# Patient Record
Sex: Female | Born: 1995 | Race: White | Hispanic: No | Marital: Married | Smoking: Former smoker
Health system: Southern US, Community
[De-identification: ages and names within clinical notes are randomized; demographics above are authoritative.]

## PROBLEM LIST (undated history)

## (undated) ENCOUNTER — Ambulatory Visit: Admission: EM | Attending: Family Medicine | Admitting: Family Medicine

## (undated) DIAGNOSIS — B20 Human immunodeficiency virus [HIV] disease: Secondary | ICD-10-CM

## (undated) DIAGNOSIS — F419 Anxiety disorder, unspecified: Secondary | ICD-10-CM

## (undated) DIAGNOSIS — F32A Depression, unspecified: Secondary | ICD-10-CM

## (undated) HISTORY — DX: Human immunodeficiency virus (HIV) disease: B20

## (undated) HISTORY — DX: Depression, unspecified: F32.A

## (undated) HISTORY — PX: TYMPANOSTOMY TUBE PLACEMENT: SHX32

## (undated) HISTORY — DX: Anxiety disorder, unspecified: F41.9

---

## 2013-02-21 ENCOUNTER — Emergency Department (HOSPITAL_BASED_OUTPATIENT_CLINIC_OR_DEPARTMENT_OTHER)
Admission: EM | Admit: 2013-02-21 | Discharge: 2013-02-21 | Disposition: A | Discharge status: Home / Self Care | Payer: Medicaid Other | Attending: Emergency Medicine | Admitting: Emergency Medicine

## 2013-02-21 ENCOUNTER — Encounter (HOSPITAL_BASED_OUTPATIENT_CLINIC_OR_DEPARTMENT_OTHER): Payer: Self-pay | Admitting: Emergency Medicine

## 2013-02-21 ENCOUNTER — Emergency Department (HOSPITAL_BASED_OUTPATIENT_CLINIC_OR_DEPARTMENT_OTHER): Payer: Medicaid Other

## 2013-02-21 DIAGNOSIS — J029 Acute pharyngitis, unspecified: Secondary | ICD-10-CM | POA: Insufficient documentation

## 2013-02-21 DIAGNOSIS — S60222A Contusion of left hand, initial encounter: Secondary | ICD-10-CM

## 2013-02-21 DIAGNOSIS — F17200 Nicotine dependence, unspecified, uncomplicated: Secondary | ICD-10-CM | POA: Insufficient documentation

## 2013-02-21 DIAGNOSIS — S60229A Contusion of unspecified hand, initial encounter: Secondary | ICD-10-CM | POA: Insufficient documentation

## 2013-02-21 DIAGNOSIS — S0993XA Unspecified injury of face, initial encounter: Secondary | ICD-10-CM | POA: Insufficient documentation

## 2013-02-21 DIAGNOSIS — R070 Pain in throat: Secondary | ICD-10-CM

## 2013-02-21 MED ORDER — IBUPROFEN 800 MG PO TABS
800.0000 mg | ORAL_TABLET | Freq: Once | ORAL | Status: AC
  Administered 2013-02-21: 800 mg via ORAL
  Filled 2013-02-21: qty 1

## 2013-02-21 NOTE — Discharge Instructions (Signed)
Contusion A contusion is a deep bruise. Contusions are the result of an injury that caused bleeding under the skin. The contusion may turn blue, purple, or yellow. Minor injuries will give you a painless contusion, but more severe contusions may stay painful and swollen for a few weeks.  CAUSES  A contusion is usually caused by a blow, trauma, or direct force to an area of the body. SYMPTOMS   Swelling and redness of the injured area.  Bruising of the injured area.  Tenderness and soreness of the injured area.  Pain. DIAGNOSIS  The diagnosis can be made by taking a history and physical exam. An X-ray, CT scan, or MRI may be needed to determine if there were any associated injuries, such as fractures. TREATMENT  Specific treatment will depend on what area of the body was injured. In general, the best treatment for a contusion is resting, icing, elevating, and applying cold compresses to the injured area. Over-the-counter medicines may also be recommended for pain control. Ask your caregiver what the best treatment is for your contusion. HOME CARE INSTRUCTIONS   Put ice on the injured area.  Put ice in a plastic bag.  Place a towel between your skin and the bag.  Leave the ice on for 15-20 minutes, 3-4 times a day.  Only take over-the-counter or prescription medicines for pain, discomfort, or fever as directed by your caregiver. Your caregiver may recommend avoiding anti-inflammatory medicines (aspirin, ibuprofen, and naproxen) for 48 hours because these medicines may increase bruising.  Rest the injured area.  If possible, elevate the injured area to reduce swelling. SEEK IMMEDIATE MEDICAL CARE IF:   You have increased bruising or swelling.  You have pain that is getting worse.  Your swelling or pain is not relieved with medicines. MAKE SURE YOU:   Understand these instructions.  Will watch your condition.  Will get help right away if you are not doing well or get  worse. Document Released: 01/24/2005 Document Revised: 07/09/2011 Document Reviewed: 02/19/2011 Exit[employer] Patient Information 2014 [employer].  
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
## 2015-01-07 ENCOUNTER — Ambulatory Visit (HOSPITAL_COMMUNITY): Payer: Medicaid Other

## 2019-05-01 HISTORY — PX: WISDOM TOOTH EXTRACTION: SHX21

## 2021-07-18 ENCOUNTER — Encounter: Payer: Self-pay | Admitting: *Deleted

## 2021-07-18 ENCOUNTER — Telehealth: Payer: Self-pay | Admitting: Family Medicine

## 2021-07-18 NOTE — Telephone Encounter (Signed)
Patient was referred, I called to setup intake and new OB but was not able to reach patient.Marland KitchenMarland KitchenI left patient a voicemail to call back and schedule. ?

## 2021-07-18 NOTE — Telephone Encounter (Signed)
Patient called in regards to setting up prenatal care, after consulting with the patient she is not 100% sure of her GA due to having irregular periods and having conflicting dates to go off of. I told the patient I would send a message to the nurses and see if a dating ultrasound could be scheduled.  ?

## 2021-07-19 ENCOUNTER — Encounter: Payer: Self-pay | Admitting: Certified Nurse Midwife

## 2021-07-21 NOTE — Telephone Encounter (Signed)
Returned call to patient; VM left stating I am calling to follow up on her phone call to schedule appt. ? ?Per chart review, pt had [place] at [facility] on 07/06/21 that could not confirm IUP. Serial beta HCG completed 07/06/21 62.5 and 07/09/21 113.9. ? ?If patient calls back, will need provider review and recommendation for [place] vs serial beta HCG prior to scheduling new OB. ?

## 2021-08-06 ENCOUNTER — Emergency Department: Payer: Medicaid Other | Admitting: Anesthesiology

## 2021-08-06 ENCOUNTER — Emergency Department: Payer: Medicaid Other

## 2021-08-06 ENCOUNTER — Encounter
Admission: EM | Disposition: A | Discharge status: Home / Self Care | Payer: Self-pay | Source: Home / Self Care | Attending: Emergency Medicine

## 2021-08-06 ENCOUNTER — Ambulatory Visit
Admission: EM | Admit: 2021-08-06 | Discharge: 2021-08-06 | Disposition: A | Discharge status: Home / Self Care | Payer: Medicaid Other | Attending: Emergency Medicine | Admitting: Emergency Medicine

## 2021-08-06 ENCOUNTER — Other Ambulatory Visit: Payer: Self-pay

## 2021-08-06 DIAGNOSIS — O99331 Smoking (tobacco) complicating pregnancy, first trimester: Secondary | ICD-10-CM | POA: Diagnosis not present

## 2021-08-06 DIAGNOSIS — O99330 Smoking (tobacco) complicating pregnancy, unspecified trimester: Secondary | ICD-10-CM | POA: Diagnosis not present

## 2021-08-06 DIAGNOSIS — O008 Other ectopic pregnancy without intrauterine pregnancy: Secondary | ICD-10-CM

## 2021-08-06 DIAGNOSIS — O009 Unspecified ectopic pregnancy without intrauterine pregnancy: Secondary | ICD-10-CM

## 2021-08-06 DIAGNOSIS — Z3A01 Less than 8 weeks gestation of pregnancy: Secondary | ICD-10-CM | POA: Insufficient documentation

## 2021-08-06 DIAGNOSIS — F17210 Nicotine dependence, cigarettes, uncomplicated: Secondary | ICD-10-CM | POA: Diagnosis not present

## 2021-08-06 DIAGNOSIS — O209 Hemorrhage in early pregnancy, unspecified: Secondary | ICD-10-CM | POA: Diagnosis present

## 2021-08-06 HISTORY — DX: Other ectopic pregnancy without intrauterine pregnancy: O00.80

## 2021-08-06 HISTORY — PX: XI ROBOTIC ASSISTED SALPINGECTOMY: SHX6824

## 2021-08-06 LAB — CBC WITH DIFFERENTIAL/PLATELET
Abs Immature Granulocytes: 0.03 10*3/uL (ref 0.00–0.07)
Basophils Absolute: 0 10*3/uL (ref 0.0–0.1)
Basophils Relative: 0 %
Eosinophils Absolute: 0.1 10*3/uL (ref 0.0–0.5)
Eosinophils Relative: 1 %
HCT: 44 % (ref 36.0–46.0)
Hemoglobin: 14.8 g/dL (ref 12.0–15.0)
Immature Granulocytes: 0 %
Lymphocytes Relative: 23 %
Lymphs Abs: 1.9 10*3/uL (ref 0.7–4.0)
MCH: 29 pg (ref 26.0–34.0)
MCHC: 33.6 g/dL (ref 30.0–36.0)
MCV: 86.1 fL (ref 80.0–100.0)
Monocytes Absolute: 0.5 10*3/uL (ref 0.1–1.0)
Monocytes Relative: 6 %
Neutro Abs: 5.9 10*3/uL (ref 1.7–7.7)
Neutrophils Relative %: 70 %
Platelets: 331 10*3/uL (ref 150–400)
RBC: 5.11 MIL/uL (ref 3.87–5.11)
RDW: 12.6 % (ref 11.5–15.5)
WBC: 8.5 10*3/uL (ref 4.0–10.5)
nRBC: 0 % (ref 0.0–0.2)

## 2021-08-06 LAB — URINALYSIS, ROUTINE W REFLEX MICROSCOPIC
Bacteria, UA: NONE SEEN
Bilirubin Urine: NEGATIVE
Glucose, UA: NEGATIVE mg/dL
Ketones, ur: NEGATIVE mg/dL
Nitrite: NEGATIVE
Protein, ur: NEGATIVE mg/dL
Specific Gravity, Urine: 1.019 (ref 1.005–1.030)
pH: 9 — ABNORMAL HIGH (ref 5.0–8.0)

## 2021-08-06 LAB — POC URINE PREG, ED: Preg Test, Ur: POSITIVE — AB

## 2021-08-06 LAB — TYPE AND SCREEN
ABO/RH(D): A POS
Antibody Screen: NEGATIVE

## 2021-08-06 LAB — HCG, QUANTITATIVE, PREGNANCY: hCG, Beta Chain, Quant, S: 6924 m[IU]/mL — ABNORMAL HIGH (ref <5)

## 2021-08-06 SURGERY — SALPINGECTOMY, ROBOT-ASSISTED
Anesthesia: General | Site: Abdomen | Laterality: Left

## 2021-08-06 MED ORDER — PROPOFOL 10 MG/ML IV BOLUS
INTRAVENOUS | Status: DC | PRN
  Administered 2021-08-06: 50 mg via INTRAVENOUS

## 2021-08-06 MED ORDER — FENTANYL CITRATE (PF) 100 MCG/2ML IJ SOLN
INTRAMUSCULAR | Status: AC
  Administered 2021-08-06: 50 ug via INTRAVENOUS
  Filled 2021-08-06: qty 2

## 2021-08-06 MED ORDER — OXYCODONE HCL 5 MG PO TABS: 5.0000 mg | ORAL_TABLET | Freq: Three times a day (TID) | ORAL | 0 refills | Status: DC | PRN

## 2021-08-06 MED ORDER — SODIUM CHLORIDE (PF) 0.9 % IJ SOLN
INTRAMUSCULAR | Status: AC
  Filled 2021-08-06: qty 50

## 2021-08-06 MED ORDER — PROPOFOL 10 MG/ML IV BOLUS
INTRAVENOUS | Status: AC
  Filled 2021-08-06: qty 20

## 2021-08-06 MED ORDER — CEFAZOLIN SODIUM-DEXTROSE 2-3 GM-%(50ML) IV SOLR
INTRAVENOUS | Status: DC | PRN
  Administered 2021-08-06: 2 g via INTRAVENOUS

## 2021-08-06 MED ORDER — KETAMINE HCL 50 MG/5ML IJ SOSY
PREFILLED_SYRINGE | INTRAMUSCULAR | Status: AC
  Filled 2021-08-06: qty 5

## 2021-08-06 MED ORDER — DEXAMETHASONE SODIUM PHOSPHATE 10 MG/ML IJ SOLN
INTRAMUSCULAR | Status: DC | PRN
  Administered 2021-08-06: 10 mg via INTRAVENOUS

## 2021-08-06 MED ORDER — LACTATED RINGERS IV SOLN: INTRAVENOUS | Status: DC

## 2021-08-06 MED ORDER — LIDOCAINE HCL (CARDIAC) PF 100 MG/5ML IV SOSY
PREFILLED_SYRINGE | INTRAVENOUS | Status: DC | PRN
  Administered 2021-08-06: 60 mg via INTRAVENOUS

## 2021-08-06 MED ORDER — SODIUM CHLORIDE 0.9 % IV BOLUS
1000.0000 mL | Freq: Once | INTRAVENOUS | Status: AC
  Administered 2021-08-06: 1000 mL via INTRAVENOUS

## 2021-08-06 MED ORDER — ACETAMINOPHEN 500 MG PO TABS: 1000.0000 mg | ORAL_TABLET | Freq: Once | ORAL | Status: AC

## 2021-08-06 MED ORDER — SODIUM CHLORIDE (PF) 0.9 % IJ SOLN
INTRAMUSCULAR | Status: AC
  Filled 2021-08-06: qty 20

## 2021-08-06 MED ORDER — OXYCODONE HCL 5 MG/5ML PO SOLN: 5.0000 mg | Freq: Once | ORAL | Status: AC | PRN

## 2021-08-06 MED ORDER — FENTANYL CITRATE (PF) 100 MCG/2ML IJ SOLN
INTRAMUSCULAR | Status: DC | PRN
  Administered 2021-08-06 (×3): 50 ug via INTRAVENOUS

## 2021-08-06 MED ORDER — ACETAMINOPHEN 500 MG PO TABS
1000.0000 mg | ORAL_TABLET | Freq: Four times a day (QID) | ORAL | 0 refills | Status: DC | PRN
Start: 2021-08-06 — End: 2021-08-29

## 2021-08-06 MED ORDER — SUCCINYLCHOLINE CHLORIDE 200 MG/10ML IV SOSY
PREFILLED_SYRINGE | INTRAVENOUS | Status: DC | PRN
  Administered 2021-08-06: 100 mg via INTRAVENOUS

## 2021-08-06 MED ORDER — ONDANSETRON HCL 4 MG/2ML IJ SOLN: 4.0000 mg | Freq: Once | INTRAMUSCULAR | Status: DC | PRN

## 2021-08-06 MED ORDER — KETOROLAC TROMETHAMINE 30 MG/ML IJ SOLN
INTRAMUSCULAR | Status: DC | PRN
Start: 2021-08-06 — End: 2021-08-06
  Administered 2021-08-06: 30 mg via INTRAVENOUS

## 2021-08-06 MED ORDER — IBUPROFEN 600 MG PO TABS
ORAL_TABLET | ORAL | Status: AC
  Administered 2021-08-06: 600 mg via ORAL
  Filled 2021-08-06: qty 1

## 2021-08-06 MED ORDER — CEFAZOLIN SODIUM 1 G IJ SOLR
INTRAMUSCULAR | Status: AC
  Filled 2021-08-06: qty 20

## 2021-08-06 MED ORDER — MORPHINE SULFATE (PF) 4 MG/ML IV SOLN: 1.0000 mg | INTRAVENOUS | Status: DC | PRN

## 2021-08-06 MED ORDER — PHENYLEPHRINE HCL (PRESSORS) 10 MG/ML IV SOLN
INTRAVENOUS | Status: DC | PRN
  Administered 2021-08-06: 160 ug via INTRAVENOUS
  Administered 2021-08-06 (×2): 80 ug via INTRAVENOUS

## 2021-08-06 MED ORDER — IBUPROFEN 600 MG PO TABS
600.0000 mg | ORAL_TABLET | Freq: Four times a day (QID) | ORAL | 0 refills | Status: DC | PRN
Start: 2021-08-06 — End: 2021-08-29

## 2021-08-06 MED ORDER — PHENYLEPHRINE HCL (PRESSORS) 10 MG/ML IV SOLN
INTRAVENOUS | Status: AC
  Filled 2021-08-06: qty 1

## 2021-08-06 MED ORDER — MIDAZOLAM HCL 2 MG/2ML IJ SOLN
INTRAMUSCULAR | Status: DC | PRN
Start: 2021-08-06 — End: 2021-08-06
  Administered 2021-08-06: 2 mg via INTRAVENOUS

## 2021-08-06 MED ORDER — KETAMINE HCL 10 MG/ML IJ SOLN
INTRAMUSCULAR | Status: DC | PRN
  Administered 2021-08-06: 25 mg via INTRAVENOUS
  Administered 2021-08-06: 5 mg via INTRAVENOUS

## 2021-08-06 MED ORDER — VASOPRESSIN 20 UNIT/ML IV SOLN
INTRAVENOUS | Status: AC
  Filled 2021-08-06: qty 1

## 2021-08-06 MED ORDER — ACETAMINOPHEN 500 MG PO TABS
ORAL_TABLET | ORAL | Status: AC
  Administered 2021-08-06: 1000 mg via ORAL
  Filled 2021-08-06: qty 2

## 2021-08-06 MED ORDER — OXYCODONE HCL 5 MG PO TABS
ORAL_TABLET | ORAL | Status: AC
  Administered 2021-08-06: 5 mg via ORAL
  Filled 2021-08-06: qty 1

## 2021-08-06 MED ORDER — FENTANYL CITRATE (PF) 100 MCG/2ML IJ SOLN
25.0000 ug | INTRAMUSCULAR | Status: DC | PRN
  Administered 2021-08-06 (×2): 25 ug via INTRAVENOUS

## 2021-08-06 MED ORDER — 0.9 % SODIUM CHLORIDE (POUR BTL) OPTIME
TOPICAL | Status: DC | PRN
Start: 2021-08-06 — End: 2021-08-06
  Administered 2021-08-06: 10 mL

## 2021-08-06 MED ORDER — SODIUM CHLORIDE 0.9 % IR SOLN
Status: DC | PRN
  Administered 2021-08-06: 300 mL

## 2021-08-06 MED ORDER — OXYCODONE HCL 5 MG PO TABS: 5.0000 mg | ORAL_TABLET | Freq: Once | ORAL | Status: AC | PRN

## 2021-08-06 MED ORDER — ONDANSETRON HCL 4 MG/2ML IJ SOLN
INTRAMUSCULAR | Status: DC | PRN
  Administered 2021-08-06: 4 mg via INTRAVENOUS

## 2021-08-06 MED ORDER — VASOPRESSIN 20 UNIT/ML IV SOLN
INTRAVENOUS | Status: DC | PRN
  Administered 2021-08-06: 25 mL via INTRAMUSCULAR

## 2021-08-06 MED ORDER — POVIDONE-IODINE 10 % EX SWAB
2.0000 "application " | Freq: Once | CUTANEOUS | Status: DC
  Filled 2021-08-06: qty 2

## 2021-08-06 MED ORDER — BUPIVACAINE LIPOSOME 1.3 % IJ SUSP
INTRAMUSCULAR | Status: DC | PRN
  Administered 2021-08-06: 20 mL

## 2021-08-06 MED ORDER — FENTANYL CITRATE (PF) 100 MCG/2ML IJ SOLN
INTRAMUSCULAR | Status: AC
Start: 2021-08-06 — End: ?
  Filled 2021-08-06: qty 2

## 2021-08-06 MED ORDER — HEMOSTATIC AGENTS (NO CHARGE) OPTIME
TOPICAL | Status: DC | PRN
  Administered 2021-08-06: 1 via TOPICAL

## 2021-08-06 MED ORDER — ROCURONIUM BROMIDE 100 MG/10ML IV SOLN
INTRAVENOUS | Status: DC | PRN
  Administered 2021-08-06: 10 mg via INTRAVENOUS
  Administered 2021-08-06 (×2): 20 mg via INTRAVENOUS
  Administered 2021-08-06: 40 mg via INTRAVENOUS
  Administered 2021-08-06: 10 mg via INTRAVENOUS

## 2021-08-06 MED ORDER — MIDAZOLAM HCL 2 MG/2ML IJ SOLN
INTRAMUSCULAR | Status: AC
  Filled 2021-08-06: qty 2

## 2021-08-06 MED ORDER — KETOROLAC TROMETHAMINE 30 MG/ML IJ SOLN
INTRAMUSCULAR | Status: AC
  Filled 2021-08-06: qty 1

## 2021-08-06 MED ORDER — SUGAMMADEX SODIUM 200 MG/2ML IV SOLN
INTRAVENOUS | Status: DC | PRN
  Administered 2021-08-06: 170 mg via INTRAVENOUS

## 2021-08-06 MED ORDER — IBUPROFEN 600 MG PO TABS
600.0000 mg | ORAL_TABLET | Freq: Once | ORAL | Status: AC
  Filled 2021-08-06: qty 1

## 2021-08-06 MED ORDER — FENTANYL CITRATE (PF) 100 MCG/2ML IJ SOLN
INTRAMUSCULAR | Status: AC
  Filled 2021-08-06: qty 2

## 2021-08-06 SURGICAL SUPPLY — 78 items
ADH SKN CLS APL DERMABOND .7 (GAUZE/BANDAGES/DRESSINGS) ×1
APL PRP STRL LF DISP 70% ISPRP (MISCELLANEOUS)
APL SRG 38 LTWT LNG FL B (MISCELLANEOUS) ×1
APPLICATOR ARISTA FLEXITIP XL (MISCELLANEOUS) ×1 IMPLANT
BACTOSHIELD CHG 4% 4OZ (MISCELLANEOUS) ×1
BAG DRN RND TRDRP ANRFLXCHMBR (UROLOGICAL SUPPLIES) ×1
BAG LAPAROSCOPIC 12 15 PORT 16 (BASKET) IMPLANT
BAG RETRIEVAL 12/15 (BASKET)
BAG URINE DRAIN 2000ML AR STRL (UROLOGICAL SUPPLIES) ×2 IMPLANT
BASIN GRAD PLASTIC 32OZ STRL (MISCELLANEOUS) ×2 IMPLANT
BLADE SURG SZ10 CARB STEEL (BLADE) IMPLANT
BLADE SURG SZ11 CARB STEEL (BLADE) ×2 IMPLANT
CANNULA REDUC XI 12-8 STAPL (CANNULA) ×1
CANNULA REDUCER 12-8 DVNC XI (CANNULA) IMPLANT
CATH FOLEY SIL 2WAY 14FR5CC (CATHETERS) ×2 IMPLANT
CHLORAPREP W/TINT 26 (MISCELLANEOUS) ×1 IMPLANT
COVER MAYO STAND REUSABLE (DRAPES) ×2 IMPLANT
COVER WAND RF STERILE (DRAPES) ×1 IMPLANT
DERMABOND ADVANCED (GAUZE/BANDAGES/DRESSINGS) ×1
DERMABOND ADVANCED .7 DNX12 (GAUZE/BANDAGES/DRESSINGS) ×1 IMPLANT
DRAPE ARM DVNC X/XI (DISPOSABLE) ×3 IMPLANT
DRAPE COLUMN DVNC XI (DISPOSABLE) ×1 IMPLANT
DRAPE DA VINCI XI ARM (DISPOSABLE) ×4
DRAPE DA VINCI XI COLUMN (DISPOSABLE) ×1
DRAPE ROBOT W/ LEGGING 30X125 (DRAPES) ×2 IMPLANT
DRSG TEGADERM 2-3/8X2-3/4 SM (GAUZE/BANDAGES/DRESSINGS) ×3 IMPLANT
ELECT REM PT RETURN 9FT ADLT (ELECTROSURGICAL) ×2
ELECTRODE REM PT RTRN 9FT ADLT (ELECTROSURGICAL) ×1 IMPLANT
GAUZE 4X4 16PLY ~~LOC~~+RFID DBL (SPONGE) ×2 IMPLANT
GLOVE SURG SYN 6.5 ES PF (GLOVE) ×10 IMPLANT
GLOVE SURG SYN 6.5 PF PI (GLOVE) ×3 IMPLANT
GLOVE SURG UNDER POLY LF SZ6.5 (GLOVE) ×8 IMPLANT
GOWN STRL REUS W/ TWL LRG LVL3 (GOWN DISPOSABLE) ×3 IMPLANT
GOWN STRL REUS W/TWL LRG LVL3 (GOWN DISPOSABLE) ×6
GRASPER SUT TROCAR 14GX15 (MISCELLANEOUS) ×2 IMPLANT
HEMOSTAT ARISTA ABSORB 3G PWDR (HEMOSTASIS) ×1 IMPLANT
IRRIGATION STRYKERFLOW (MISCELLANEOUS) IMPLANT
IRRIGATOR STRYKERFLOW (MISCELLANEOUS)
IRRIGATOR SUCT 8 DISP DVNC XI (IRRIGATION / IRRIGATOR) IMPLANT
IRRIGATOR SUCTION 8MM XI DISP (IRRIGATION / IRRIGATOR) ×1
IV NS 1000ML (IV SOLUTION) ×2
IV NS 1000ML BAXH (IV SOLUTION) IMPLANT
KIT PINK PAD W/HEAD ARE REST (MISCELLANEOUS) ×2 IMPLANT
KIT PINK PAD W/HEAD ARM REST (MISCELLANEOUS) ×1 IMPLANT
KONNER MANUPULATOR 6003 ×1 IMPLANT
LABEL OR SOLS (LABEL) ×2 IMPLANT
MANIFOLD NEPTUNE II (INSTRUMENTS) ×1 IMPLANT
MANIPULATOR URINE KRONNER 5 (MISCELLANEOUS) IMPLANT
MANIPULATOR UTERINE 4.5 ZUMI (MISCELLANEOUS) ×2 IMPLANT
NEEDLE HYPO 22GX1.5 SAFETY (NEEDLE) ×2 IMPLANT
NS IRRIG 1000ML POUR BTL (IV SOLUTION) ×4 IMPLANT
OBTURATOR OPTICAL STANDARD 8MM (TROCAR) ×1
OBTURATOR OPTICAL STND 8 DVNC (TROCAR) ×1
OBTURATOR OPTICALSTD 8 DVNC (TROCAR) ×1 IMPLANT
PACK GYN LAPAROSCOPIC (MISCELLANEOUS) ×2 IMPLANT
PAD ARMBOARD 7.5X6 YLW CONV (MISCELLANEOUS) ×1 IMPLANT
PAD OB MATERNITY 4.3X12.25 (PERSONAL CARE ITEMS) ×2 IMPLANT
PAD PREP 24X41 OB/GYN DISP (PERSONAL CARE ITEMS) ×2 IMPLANT
SCRUB CHG 4% DYNA-HEX 4OZ (MISCELLANEOUS) ×1 IMPLANT
SEAL CANN UNIV 5-8 DVNC XI (MISCELLANEOUS) ×3 IMPLANT
SEAL XI 5MM-8MM UNIVERSAL (MISCELLANEOUS) ×3
SEALER VESSEL DA VINCI XI (MISCELLANEOUS) ×1
SEALER VESSEL EXT DVNC XI (MISCELLANEOUS) ×1 IMPLANT
SET TUBE SMOKE EVAC HIGH FLOW (TUBING) ×2 IMPLANT
SPONGE GAUZE 2X2 8PLY STRL LF (GAUZE/BANDAGES/DRESSINGS) ×3 IMPLANT
STAPLER CANNULA SEAL DVNC XI (STAPLE) IMPLANT
STAPLER CANNULA SEAL XI (STAPLE) ×1
SURGILUBE 2OZ TUBE FLIPTOP (MISCELLANEOUS) ×2 IMPLANT
SUT MNCRL 4-0 (SUTURE) ×2
SUT MNCRL 4-0 27XMFL (SUTURE) ×1
SUT STRATAFIX 0 PDS+ CT-2 23 (SUTURE) ×2
SUT STRATAFIX SPIRAL PDS+ 0 30 (SUTURE) ×1 IMPLANT
SUT VIC AB 0 CT1 36 (SUTURE) ×1 IMPLANT
SUTURE MNCRL 4-0 27XMF (SUTURE) ×1 IMPLANT
SUTURE STRATFX 0 PDS+ CT-2 23 (SUTURE) IMPLANT
SYR 10ML LL (SYRINGE) ×2 IMPLANT
TAPE TRANSPORE STRL 2 31045 (GAUZE/BANDAGES/DRESSINGS) ×1 IMPLANT
WATER STERILE IRR 500ML POUR (IV SOLUTION) ×2 IMPLANT

## 2021-08-06 NOTE — Anesthesia Preprocedure Evaluation (Addendum)
Anesthesia Evaluation  ?Patient identified by MRN, date of birth, ID band ?Patient awake ? ? ? ?Reviewed: ?Allergy & Precautions, H&P , NPO status , Patient's Chart, lab work & pertinent test results ? ?History of Anesthesia Complications ?Negative for: history of anesthetic complications ? ?Airway ?Mallampati: II ? ?TM Distance: >3 FB ?Neck ROM: full ? ? ? Dental ? ?(+) Teeth Intact ?  ?Pulmonary ?neg sleep apnea, neg COPD, Smoking history: former.,  ?  ?breath sounds clear to auscultation ? ? ? ? ? ? Cardiovascular ?(-) angina(-) Past MI and (-) Cardiac Stents negative cardio ROS ? ?(-) dysrhythmias  ?Rhythm:regular Rate:Normal ? ? ?  ?Neuro/Psych ?PSYCHIATRIC DISORDERS Anxiety Depression negative neurological ROS ?   ? GI/Hepatic ?negative GI ROS, Neg liver ROS,   ?Endo/Other  ?Obese - BMI 34 ? ? Renal/GU ?  ? ?  ?Musculoskeletal ? ? Abdominal ?  ?Peds ? Hematology ?negative hematology ROS ?(+)   ?Anesthesia Other Findings ?Past Medical History: ?No date: Anxiety ?No date: Depression ? ?Past Surgical History: ?No date: TYMPANOSTOMY TUBE PLACEMENT; Right ?    Comment:  2013 and previously as a child ? ?BMI   ? Body Mass Index: 33.82 kg/m?  ?  ? ? Reproductive/Obstetrics ?(+) Pregnancy ?Ectopic pregnancy ? ?S/f robotic salpingetcomy ? ?  ? ? ? ? ? ? ? ? ? ? ? ? ? ?  ?  ? ? ? ? ? ? ?Anesthesia Physical ?Anesthesia Plan ? ?ASA: 3 and emergent ? ?Anesthesia Plan: General  ? ?Post-op Pain Management:   ? ?Induction: Intravenous ? ?PONV Risk Score and Plan: Ondansetron, Midazolam and Dexamethasone ? ?Airway Management Planned:  ? ?Additional Equipment:  ? ?Intra-op Plan:  ? ?Post-operative Plan:  ? ?Informed Consent: I have reviewed the patients History and Physical, chart, labs and discussed the procedure including the risks, benefits and alternatives for the proposed anesthesia with the patient or authorized representative who has indicated his/her understanding and acceptance.   ? ? ? ?Dental Advisory Given ? ?Plan Discussed with: Anesthesiologist, CRNA and Surgeon ? ?Anesthesia Plan Comments:   ? ? ? ? ? ?Anesthesia Quick Evaluation ? ? ? ?Pt not NPO appropriate.  Per Dr. Jerene Pitch, case is emergent and cannot wait. ? ?GETA RSI ?Standard monitors ? ?Risks discussed and all questions answered. ? ?K Merlie Noga ?

## 2021-08-06 NOTE — ED Provider Notes (Signed)
? ?[facility] ?Provider Note ? ? ? Event Date/Time  ? First MD Initiated Contact with Patient 08/06/21 1412   ?  (approximate) ? ? ?History  ? ?Vaginal Bleeding ? ? ?HPI ? ?Jasmine Price is a 26 y.o. female G3, P2 who presents with vaginal bleeding.  Patient had a positive pregnancy test on 3/6.  Had some bleeding around that time and was seen at [facility] where she had a beta-hCG of 62 which then doubled to 113 and 2 days.  Ultrasound was too early to see anything.  Has not followed up since then.  Then had additional episode of bleeding yesterday which initially stopped but then resumed again today.  It is bright red she is not soaking through a pad or passing clots.  Does have some low back pain no up significant abdominal cramping.  Denies fevers chills.  Denies dysuria.  Has been treated for yeast infection with Monistat thinks that that is improving.  Did have vaginal bleeding in both of her other pregnancies which were carried to term. ?  ? ?Past Medical History:  ?Diagnosis Date  ? Anxiety   ? Depression   ? ? ?There are no problems to display for this patient. ? ? ? ?Physical Exam  ?Triage Vital Signs: ?ED Triage Vitals  ?Enc Vitals Group  ?   BP 08/06/21 1326 (!) 115/105  ?   Pulse Rate 08/06/21 1326 99  ?   Resp 08/06/21 1326 18  ?   Temp 08/06/21 1326 98.1 ?F (36.7 ?C)  ?   Temp Source 08/06/21 1326 Oral  ?   SpO2 08/06/21 1326 97 %  ?   Weight 08/06/21 1323 179 lb (81.2 kg)  ?   Height 08/06/21 1323 5\' 1"  (1.549 m)  ?   Head Circumference --   ?   Peak Flow --   ?   Pain Score 08/06/21 1323 5  ?   Pain Loc --   ?   Pain Edu? --   ?   Excl. in [place]? --   ? ? ?Most recent vital signs: ?Vitals:  ? 08/06/21 1326 08/06/21 1711  ?BP: (!) 115/105 (!) 110/56  ?Pulse: 99 94  ?Resp: 18 18  ?Temp: 98.1 ?F (36.7 ?C)   ?SpO2: 97% 96%  ? ? ? ?General: Awake, no distress.  ?CV:  Good peripheral perfusion.  ?Resp:  Normal effort.  ?Abd:  No distention.  Soft and nontender throughout ?Neuro:              Awake, Alert, Oriented x 3  ?Other:  On external vaginal exam, there is scant bleeding, no obvious discharge or irritation of vulva ? ? ?ED Results / Procedures / Treatments  ?Labs ?(all labs ordered are listed, but only abnormal results are displayed) ?Labs Reviewed  ?HCG, QUANTITATIVE, PREGNANCY - Abnormal; Notable for the following components:  ?    Result Value  ? hCG, Beta Chain, Quant, S 6,924 (*)   ? All other components within normal limits  ?URINALYSIS, ROUTINE W REFLEX MICROSCOPIC - Abnormal; Notable for the following components:  ? Color, Urine YELLOW (*)   ? APPearance CLEAR (*)   ? pH 9.0 (*)   ? Hgb urine dipstick MODERATE (*)   ? Leukocytes,Ua TRACE (*)   ? All other components within normal limits  ?POC URINE PREG, ED - Abnormal; Notable for the following components:  ? Preg Test, Ur POSITIVE (*)   ? All other components within normal  limits  ?CBC WITH DIFFERENTIAL/PLATELET  ?TYPE AND SCREEN  ? ? ? ?EKG ? ? ? ? ?RADIOLOGY ?Reviewed patient's for semester ultrasound which shows a hypervascular structure in the left adnexa ? ? ?PROCEDURES: ? ?Critical Care performed: No ? ?Procedures ? ? ? ?MEDICATIONS ORDERED IN ED: ?Medications  ?sodium chloride 0.9 % bolus 1,000 mL (1,000 mLs Intravenous New Bag/Given 08/06/21 1707)  ? ? ? ?IMPRESSION / MDM / ASSESSMENT AND PLAN / ED COURSE  ?I reviewed the triage vital signs and the nursing notes. ?             ?               ? ?Differential diagnosis includes, but is not limited to, ectopic pregnancy, spontaneous miscarriage, threatened miscarriage, subchorionic hemorrhage ? ?Patient is a 26 year old female G3 P2 who presents with vaginal bleeding.  Seen about a month ago at [facility] and had pregnancy of undetermined location but beta-hCG was quite low at that time.  Patient does not have regular monthly menstrual period so unsure how far along she is last time she had blood was about 5 weeks ago.  Vital signs within normal limits.  Beta-hCG is around 7000 and, UA  with 21-50 WBCs hemoglobin is normal no leukocytosis.  Patient overall well-appearing abdomen is soft and nontender.  Will obtain a first trimester ultrasound. ? ?Ultrasound does not show any IUP.  There is a hypervascular structure in the left adnexal region.  Radiology called and notes that this could be cornual versus in the left fallopian tube.  Discussed with Dr. Gilman Schmidt with OB/GYN who plans to take the patient to the OR.  Type and screen sent.  Fluids started. ?Clinical Course as of 08/06/21 1810  ?Sun Aug 06, 2021  ?1440 3/6- positive preg test  ?Few days before bleeding ?Every 3 mo ?62, 113 in 2 days ? ?G3P2, hx of vaginal bleeding [KM]  ?  ?Clinical Course User Index ?[KM] Rada Hay, MD  ? ? ? ?FINAL CLINICAL IMPRESSION(S) / ED DIAGNOSES  ? ?Final diagnoses:  ?Ectopic pregnancy without intrauterine pregnancy, unspecified location  ? ? ? ?Rx / DC Orders  ? ?ED Discharge Orders   ? ? None  ? ?  ? ? ? ?Note:  This document was prepared using Dragon voice recognition software and may include unintentional dictation errors. ?  ?Rada Hay, MD ?08/06/21 1810 ? ?

## 2021-08-06 NOTE — H&P (Signed)
Jasmine Price is an 26 y.o. female.   ?Chief Complaint: Vaginal bleeding ?HPI: Patient presented to the ER reporting vaginal bleeding which started earlier today. It is similar to a menstrual cycle. She does not have a clear LMP. She first had a positive pregnancy test a month ago. Her beta HCG levels were low but she did not have ongoing follow up or ultrasounds to establish the location of the pregnancy She has had some lower back pain. She reports her whole body has felt achy.  ? ? ?Past Medical History:  ?Diagnosis Date  ? Anxiety   ? Depression   ? ? ?Past Surgical History:  ?Procedure Laterality Date  ? TYMPANOSTOMY TUBE PLACEMENT Right   ? 2013 and previously as a child  ? ? ?Family History  ?Problem Relation Age of Onset  ? Hypertension Mother   ? Schizophrenia Father   ? Gout Father   ? Bipolar disorder Father   ? ?Social History:  reports that she has been smoking cigarettes. She does not have any smokeless tobacco history on file. She reports that she does not drink alcohol. No history on file for drug use. ? ?Allergies: No Known Allergies ? ?(Not in a hospital admission) ? ? ?Results for orders placed or performed during the hospital encounter of 08/06/21 (from the past 48 hour(s))  ?CBC with Differential     Status: None  ? Collection Time: 08/06/21  1:25 PM  ?Result Value Ref Range  ? WBC 8.5 4.0 - 10.5 K/uL  ? RBC 5.11 3.87 - 5.11 MIL/uL  ? Hemoglobin 14.8 12.0 - 15.0 g/dL  ? HCT 44.0 36.0 - 46.0 %  ? MCV 86.1 80.0 - 100.0 fL  ? MCH 29.0 26.0 - 34.0 pg  ? MCHC 33.6 30.0 - 36.0 g/dL  ? RDW 12.6 11.5 - 15.5 %  ? Platelets 331 150 - 400 K/uL  ? nRBC 0.0 0.0 - 0.2 %  ? Neutrophils Relative % 70 %  ? Neutro Abs 5.9 1.7 - 7.7 K/uL  ? Lymphocytes Relative 23 %  ? Lymphs Abs 1.9 0.7 - 4.0 K/uL  ? Monocytes Relative 6 %  ? Monocytes Absolute 0.5 0.1 - 1.0 K/uL  ? Eosinophils Relative 1 %  ? Eosinophils Absolute 0.1 0.0 - 0.5 K/uL  ? Basophils Relative 0 %  ? Basophils Absolute 0.0 0.0 - 0.1 K/uL  ?  Immature Granulocytes 0 %  ? Abs Immature Granulocytes 0.03 0.00 - 0.07 K/uL  ?  Comment: Performed at [facility], [address] [place], [place] [id number]  ?hCG, quantitative, pregnancy     Status: Abnormal  ? Collection Time: 08/06/21  1:25 PM  ?Result Value Ref Range  ? hCG, Beta Chain, Quant, S 6,924 (H) <5 mIU/mL  ?  Comment:        ?  GEST. AGE      CONC.  (mIU/mL) ?  <=1 WEEK        5 - 50 ?    2 WEEKS       50 - 500 ?    3 WEEKS       100 - 10,000 ?    4 WEEKS     1,000 - 30,000 ?    5 WEEKS     3,500 - 115,000 ?  6-8 WEEKS     12,000 - 270,000 ?   12 WEEKS     15,000 - 220,000 ?       ?FEMALE AND  NON-PREGNANT FEMALE: ?    LESS THAN 5 mIU/mL ?Performed at [facility], [address], [place], [place] [address] ?  ?Urinalysis, Routine w reflex microscopic Urine, Clean Catch     Status: Abnormal  ? Collection Time: 08/06/21  1:29 PM  ?Result Value Ref Range  ? Color, Urine YELLOW (A) YELLOW  ? APPearance CLEAR (A) CLEAR  ? Specific Gravity, Urine 1.019 1.005 - 1.030  ? pH 9.0 (H) 5.0 - 8.0  ? Glucose, UA NEGATIVE NEGATIVE mg/dL  ? Hgb urine dipstick MODERATE (A) NEGATIVE  ? Bilirubin Urine NEGATIVE NEGATIVE  ? Ketones, ur NEGATIVE NEGATIVE mg/dL  ? Protein, ur NEGATIVE NEGATIVE mg/dL  ? Nitrite NEGATIVE NEGATIVE  ? Leukocytes,Ua TRACE (A) NEGATIVE  ? RBC / HPF 21-50 0 - 5 RBC/hpf  ? WBC, UA 21-50 0 - 5 WBC/hpf  ? Bacteria, UA NONE SEEN NONE SEEN  ? Squamous Epithelial / LPF 0-5 0 - 5  ? Mucus PRESENT   ?  Comment: Performed at [facility], [address] [place], [place] [address]  ?POC urine preg, ED (not at [facility])     Status: Abnormal  ? Collection Time: 08/06/21  1:33 PM  ?Result Value Ref Range  ? Preg Test, Ur POSITIVE (A) NEGATIVE  ?  Comment:        ?THE SENSITIVITY OF THIS ?METHODOLOGY IS >24 mIU/mL ?  ?Type and screen [facility]     Status: None  ? Collection Time: 08/06/21  5:09 PM  ?Result Value Ref Range  ? ABO/RH(D) A POS   ? Antibody Screen  NEG   ? Sample Expiration    ?  08/09/2021,2359 ?Performed at [facility], [address] [place], [place] [address] ?  ? ?US OB Comp < 14 Wks ? ?Addendum Date: 08/06/2021   ?ADDENDUM REPORT: 08/06/2021 16:09 ADDENDUM: Critical Value/emergent results were called by telephone at the time of interpretation on 08/06/2021 at 4:09 pm to provider [facility] , who verbally acknowledged these results. Electronically Signed   By: Kennith Center M.D.   On: 08/06/2021 16:09  ? ?Result Date: 08/06/2021 ?CLINICAL DATA:  Vaginal bleeding with positive pregnancy test. EXAM: OBSTETRIC <14 WK [place] AND TRANSVAGINAL OB US TECHNIQUE: Both transabdominal and transvaginal ultrasound examinations were performed for complete evaluation of the gestation as well as the maternal uterus, adnexal regions, and pelvic cul-de-sac. Transvaginal technique was performed to assess early pregnancy. COMPARISON:  None. FINDINGS: Intrauterine gestational sac: No intrauterine gestational sac. Subchorionic hemorrhage:  None visualized. Maternal uterus/adnexae: Maternal right ovary unremarkable. Left ovary cannot be visualized. There is a 3.0 x 2.4 x 1.8 cm heterogeneous complex cystic lesion contiguous with the left uterine fundal region. Color Doppler imaging shows circumferential hypervascularity associated with this lesion. No free fluid evident in the cul-de-sac. IMPRESSION: 1. No intrauterine gestational sac. 2. Complex heterogeneous cystic lesion identified at the left uterine fundus. This has circumferential hypervascularity on color Doppler imaging and could represent an ectopic gestational sac in the fallopian tube immediately adjacent to the uterus. Cornual pregnancy considered less likely but not entirely excluded. Electronically Signed: By: Kennith Center M.D. On: 08/06/2021 15:59  ? ?US OB Transvaginal ? ?Addendum Date: 08/06/2021   ?ADDENDUM REPORT: 08/06/2021 16:09 ADDENDUM: Critical Value/emergent results were called by telephone at the  time of interpretation on 08/06/2021 at 4:09 pm to provider [facility] , who verbally acknowledged these results. Electronically Signed   By: Kennith Center M.D.   On:  08/06/2021 16:09  ? ?Result Date: 08/06/2021 ?CLINICAL DATA:  Vaginal bleeding with positive pregnancy test. EXAM: OBSTETRIC <14 WK [place] AND TRANSVAGINAL OB US TECHNIQUE: Both transabdominal and transvaginal ultrasound examinations were performed for complete evaluation of the gestation as well as the maternal uterus, adnexal regions, and pelvic cul-de-sac. Transvaginal technique was performed to assess early pregnancy. COMPARISON:  None. FINDINGS: Intrauterine gestational sac: No intrauterine gestational sac. Subchorionic hemorrhage:  None visualized. Maternal uterus/adnexae: Maternal right ovary unremarkable. Left ovary cannot be visualized. There is a 3.0 x 2.4 x 1.8 cm heterogeneous complex cystic lesion contiguous with the left uterine fundal region. Color Doppler imaging shows circumferential hypervascularity associated with this lesion. No free fluid evident in the cul-de-sac. IMPRESSION: 1. No intrauterine gestational sac. 2. Complex heterogeneous cystic lesion identified at the left uterine fundus. This has circumferential hypervascularity on color Doppler imaging and could represent an ectopic gestational sac in the fallopian tube immediately adjacent to the uterus. Cornual pregnancy considered less likely but not entirely excluded. Electronically Signed: By: Kennith Center M.D. On: 08/06/2021 15:59   ? ?Review of Systems  ?Constitutional:  Negative for chills and fever.  ?HENT:  Negative for congestion, hearing loss and sinus pain.   ?Respiratory:  Negative for cough, shortness of breath and wheezing.   ?Cardiovascular:  Negative for chest pain, palpitations and leg swelling.  ?Gastrointestinal:  Negative for abdominal pain, constipation, diarrhea, nausea and vomiting.  ?Genitourinary:  Negative for dysuria, flank pain, frequency, hematuria and  urgency.  ?Musculoskeletal:  Negative for back pain.  ?Skin:  Negative for rash.  ?Neurological:  Negative for dizziness and headaches.  ?Psychiatric/Behavioral:  Negative for suicidal ideas. The patient is not

## 2021-08-06 NOTE — Transfer of Care (Signed)
Immediate Anesthesia Transfer of Care Note ? ?Patient: Jasmine Price ? ?Procedure(s) Performed: XI ROBOTIC ASSISTED  Removal of Cornual Pregnancy (Left: Abdomen) ? ?Patient Location: PACU ? ?Anesthesia Type:General ? ?Level of Consciousness: drowsy and patient cooperative ? ?Airway & Oxygen Therapy: Patient Spontanous Breathing and Patient connected to face mask oxygen ? ?Post-op Assessment: Report given to RN and Post -op Vital signs reviewed and stable ? ?Post vital signs: Reviewed and stable ? ?Last Vitals:  ?Vitals Value Taken Time  ?BP 120/64 08/06/21 2150  ?Temp 36.6 ?C 08/06/21 2150  ?Pulse 125 08/06/21 2152  ?Resp 16 08/06/21 2152  ?SpO2 100 % 08/06/21 2152  ?Vitals shown include unvalidated device data. ? ?Last Pain:  ?Vitals:  ? 08/06/21 1326  ?TempSrc: Oral  ?PainSc:   ?   ? ?  ? ?Complications: No notable events documented. ?

## 2021-08-06 NOTE — Discharge Instructions (Addendum)
Postoperative Instructions ? ?Take Motrin 600 mg and Tylenol 1000 mg every 6 hours for pain control.   I recommend taking this medication consistently for at least 1 week after your surgery. ? ?Take Roxicodone 5 mg as needed every 6 hours for severe pain.  Some people will take this before bed to make it easier to sleep.  Do not take this pain medicine longer than 7 days after your surgery. ? ?Please take small walks around your living space every 2-3 hours after your surgery.  You can start this the day of your surgery or the day after your surgery.  Continue this for the first 7 days after surgery. After the first 7 days you can slowly increase you activity. ? ?Do not lift heavy objects for 4 weeks after the surgery.  Nothing in the vagina for 2 weeks after surgery.  ? ?Try not to strain with bowel movements.  It can be a good idea to take an over-the-counter stool softener after your surgery.  You can take Colace 100 mg twice a day.  Try to drink 60 to 90 ounces of water a day to keep her stools soft. ? ?After the surgery your diet should consist of foods that are easy on your stomach.  Soups, sandwiches, crackers, rice, applesauce, popsicles, mashed potatoes, cooked vegetables or canned fruits are best.  After you have a normal bowel movement you can return to your regular diet. ? ?You should have a bowel movement within 3 days of the surgery.  If you not able to have a bowel movement please contact her office so we can review over-the-counter options to help have a bowel movement.  Walking regularly will help with having a bowel movement. ? ?Please let us know if you have any heavy bleeding from the vagina.  Small to moderate amounts of bleeding are typical after surgery. Bleeding should resolve generally within 2 weeks after the surgery.   ? ?You have a bandage covering your incisions, you should remove these bandages the day after your surgery.  Your incisions are closed with dissolvable suture and a topical  glue.  The topical glue you can peel off gently in the shower 2 weeks after your surgery.  You should shower every day after the surgery.  You can let soapy water gently run over the incisions to keep them clean.  Please contact us if your incisions appear infected or if you have any drainage of fluid from the incisions.  I would like to know about these symptoms same day since wound infections can get bad fast. ? ? ? ? ? ? ? ? ? ? ? ? ?AMBULATORY SURGERY  ?DISCHARGE INSTRUCTIONS ? ?The drugs that you were given will stay in your system until tomorrow so for the next 24 hours you should not: ?Drive an automobile ?Make any legal decisions ?Drink any alcoholic beverage ? ?You may resume regular meals tomorrow.  Today it is better to start with liquids and gradually work up to solid foods. ?You may eat anything you prefer, but it is better to start with liquids, then soup and crackers, and gradually work up to solid foods. ? ?Please notify your doctor immediately if you have any unusual bleeding, trouble breathing, redness and pain at the surgery site, drainage, fever, or pain not relieved by medication. ? ?Please contact your physician with any problems or Same Day Surgery at [phone], Monday through Friday 6 am to 4 pm, or [place] [facility] number  at [phone].  ?

## 2021-08-06 NOTE — ED Notes (Signed)
Pt to ED for first trimester bleeding, intermittent since about 1 month ago. Bleeding had stopped then started again yesterday, light pink, then today became dark red today.  ?2 small clots since yesterday. Pt states she feels "overexhausted" and her whole body hurts. She has 2 kids at home, ages 16 and 71. Does heavy lifting at work (dog grooming). States she feels stressed. ? ?Light lower back pain as well. Pt was also diagnosed with light UTI 1 month ago when seen at [facility] for vaginal bleeding, and did not finish the course of abx. Unsure if could still have UTI. Has had recent recurrent yeast infections. ?

## 2021-08-06 NOTE — Anesthesia Procedure Notes (Signed)
Procedure Name: Intubation ?Date/Time: 08/06/2021 7:00 PM ?Performed by: Omer Jack, CRNA ?Pre-anesthesia Checklist: Patient identified, Patient being monitored, Timeout performed, Emergency Drugs available and Suction available ?Patient Re-evaluated:Patient Re-evaluated prior to induction ?Oxygen Delivery Method: Circle system utilized ?Preoxygenation: Pre-oxygenation with 100% oxygen ?Induction Type: IV induction ?Ventilation: Mask ventilation without difficulty ?Laryngoscope Size: 3 and McGraph ?Grade View: Grade I ?Tube type: Oral ?Tube size: 7.0 mm ?Number of attempts: 1 ?Airway Equipment and Method: Stylet ?Placement Confirmation: ETT inserted through vocal cords under direct vision, positive ETCO2 and breath sounds checked- equal and bilateral ?Secured at: 21 cm ?Tube secured with: Tape ?Dental Injury: Teeth and Oropharynx as per pre-operative assessment  ? ? ? ? ?

## 2021-08-06 NOTE — ED Triage Notes (Signed)
Pt states she is about 6-[redacted] weeks pregnant and having light bleeding and lower abd and back pain since yesterday, states this is the 3rd time with bleeding during this pregnancy and was seen at [facility] states had [place] but too early to see the fetus ?

## 2021-08-06 NOTE — Op Note (Signed)
Operative Note  ? ?PRE-OP DIAGNOSIS: Left Ectopic Pregnancy ?  ?POST-OP DIAGNOSIS: Left Cornual Ectopic Pregnancy ? ?SURGEON: Adelene Idler MD ? ?ASSISTANT:  None  ? ?ANESTHESIA: General ? ?PROCEDURE: Procedure(s):Robotic assisted laparoscopic left wedge resection of ectopic pregnancy ? ?ESTIMATED BLOOD LOSS: 10 cc ? ?DRAINS: Foley ? ?SPECIMENS: Left Cornual Ectopic Pregnancy ? ?COMPLICATIONS: None ? ?DISPOSITION: PACU ? ?CONDITION: Stable ? ?INDICATIONS: Left Cornual Pregnancy ? ?FINDINGS: Exam under anesthesia revealed an 8 week uterus. Intraoperative findings included: The adnexa showed normal ovaries and fallopian tubes, left cornual ectopic pregnancy. The upper abdomen was  normal including omentum, bowel, liver, stomach, appendix, and diaphragmatic surfaces. There was no evidence of grossly enlarged pelvic or right para-aortic lymph nodes.  ? ?PROCEDURE IN DETAIL: After informed consent was obtained, the patient was taken to the operating room where anesthesia was obtained without difficulty. The patient was positioned in the dorsal lithotomy position in [place] stirrups and her arms were carefully tucked at her sides and the usual precautions were taken.  She was prepped and draped in normal sterile fashion.  Time-out was performed. A foley catheter was placed. A speculum was placed in the vagina and the cervical os was dilated. The uterus sounded to 9 cm.  A standard Zumi uterine manipulator was then placed in the uterus without incident.   ? ?Laparoscopic entry was obtained via a supraumbilical incision with direct entry technique. The abdomen was insufflated, and pelvis visualized with noted findings above.  The patient was placed in Trendelenburg and the bowel was displaced up into the upper abdomen.  The 3 additional 8 mm robotic ports were placed in a horizontal line across the upper abdomen. The umbilical port was converted to a 12 mm port. Robotic docking was performed.  ? ?The left cornual ectopic  pregnancy was seen. The surrounding uterine myometrium was injected with dilute vasopressin.  I then moved to the robotic console, Using the vessel sealer  I was able to resect a wedge portion of tissue surrounding the ectopic pregnancy. The left fallopian tube was involved with the ectopic pregnancy and had to be disconnected from the left cornua to remove the pregnancy. Once the pregnancy was removed the uterine defect was closed with two layer closure of 0 Stratafix suture with a running stitch.  The pelvis was irrigated and cleared of all clots and debris. Arista was applied to the uterine incision. Excellent hemostasis was noted.  ? ?The specimen was placed into a laparoscopic bag and brought to the level of the skin incision. The robot was then undocked. ? ?The specimen was delivered thru the incision without difficulty. Care was taken to prevent spillage of specimen into the abdomen. The specimen was snet to pathology.  ? ?The umbilical fascia was closed with 0-vicryl suture and the PMI device. The instruments were then all removed from the abdomen. The air was expelled from the abdomen. The trocars were removed.  The skin incisions were closed with 4-0 monocryl with a subcuticular stitch and Indermil glue.  The patient tolerated the procedure well.  Sponge, lap and needle counts were correct x2.  The patient was taken to recovery room in excellent condition. ? ?The foley was removed from the bladder. The uterine manipulator was removed from the uterus.  ? ?Adelene Idler MD, FACOG ?[facility], [facility] ?08/06/2021 ?9:44 PM ? ?

## 2021-08-07 ENCOUNTER — Encounter: Payer: Self-pay | Admitting: Obstetrics and Gynecology

## 2021-08-08 ENCOUNTER — Emergency Department: Payer: Medicaid Other

## 2021-08-08 ENCOUNTER — Emergency Department
Admission: EM | Admit: 2021-08-08 | Discharge: 2021-08-08 | Disposition: A | Discharge status: Home / Self Care | Payer: Medicaid Other | Attending: Emergency Medicine | Admitting: Emergency Medicine

## 2021-08-08 ENCOUNTER — Telehealth: Payer: Self-pay

## 2021-08-08 DIAGNOSIS — R1084 Generalized abdominal pain: Secondary | ICD-10-CM | POA: Diagnosis not present

## 2021-08-08 DIAGNOSIS — N9489 Other specified conditions associated with female genital organs and menstrual cycle: Secondary | ICD-10-CM | POA: Diagnosis not present

## 2021-08-08 DIAGNOSIS — R102 Pelvic and perineal pain: Secondary | ICD-10-CM

## 2021-08-08 DIAGNOSIS — R109 Unspecified abdominal pain: Secondary | ICD-10-CM | POA: Diagnosis present

## 2021-08-08 LAB — BASIC METABOLIC PANEL
Anion gap: 9 (ref 5–15)
BUN: 15 mg/dL (ref 6–20)
CO2: 23 mmol/L (ref 22–32)
Calcium: 8.4 mg/dL — ABNORMAL LOW (ref 8.9–10.3)
Chloride: 106 mmol/L (ref 98–111)
Creatinine, Ser: 0.58 mg/dL (ref 0.44–1.00)
GFR, Estimated: >60 mL/min (ref >60)
Glucose, Bld: 91 mg/dL (ref 70–99)
Potassium: 3.6 mmol/L (ref 3.5–5.1)
Sodium: 138 mmol/L (ref 135–145)

## 2021-08-08 LAB — CBC
HCT: 38.8 % (ref 36.0–46.0)
Hemoglobin: 12.7 g/dL (ref 12.0–15.0)
MCH: 28.6 pg (ref 26.0–34.0)
MCHC: 32.7 g/dL (ref 30.0–36.0)
MCV: 87.4 fL (ref 80.0–100.0)
Platelets: 264 10*3/uL (ref 150–400)
RBC: 4.44 MIL/uL (ref 3.87–5.11)
RDW: 12.9 % (ref 11.5–15.5)
WBC: 12.8 10*3/uL — ABNORMAL HIGH (ref 4.0–10.5)
nRBC: 0 % (ref 0.0–0.2)

## 2021-08-08 LAB — URINALYSIS, ROUTINE W REFLEX MICROSCOPIC
Bacteria, UA: NONE SEEN
Bilirubin Urine: NEGATIVE
Glucose, UA: NEGATIVE mg/dL
Ketones, ur: NEGATIVE mg/dL
Nitrite: NEGATIVE
Protein, ur: NEGATIVE mg/dL
Specific Gravity, Urine: 1.038 — ABNORMAL HIGH (ref 1.005–1.030)
pH: 6 (ref 5.0–8.0)

## 2021-08-08 LAB — CBC WITH DIFFERENTIAL/PLATELET
Abs Immature Granulocytes: 0.05 10*3/uL (ref 0.00–0.07)
Basophils Absolute: 0 10*3/uL (ref 0.0–0.1)
Basophils Relative: 0 %
Eosinophils Absolute: 0 10*3/uL (ref 0.0–0.5)
Eosinophils Relative: 0 %
HCT: 38.9 % (ref 36.0–46.0)
Hemoglobin: 12.7 g/dL (ref 12.0–15.0)
Immature Granulocytes: 0 %
Lymphocytes Relative: 11 %
Lymphs Abs: 1.4 10*3/uL (ref 0.7–4.0)
MCH: 28.7 pg (ref 26.0–34.0)
MCHC: 32.6 g/dL (ref 30.0–36.0)
MCV: 87.8 fL (ref 80.0–100.0)
Monocytes Absolute: 0.7 10*3/uL (ref 0.1–1.0)
Monocytes Relative: 6 %
Neutro Abs: 10.1 10*3/uL — ABNORMAL HIGH (ref 1.7–7.7)
Neutrophils Relative %: 83 %
Platelets: 274 10*3/uL (ref 150–400)
RBC: 4.43 MIL/uL (ref 3.87–5.11)
RDW: 13 % (ref 11.5–15.5)
WBC: 12.3 10*3/uL — ABNORMAL HIGH (ref 4.0–10.5)
nRBC: 0 % (ref 0.0–0.2)

## 2021-08-08 LAB — HEPATIC FUNCTION PANEL
ALT: 22 U/L (ref 0–44)
AST: 19 U/L (ref 15–41)
Albumin: 3.7 g/dL (ref 3.5–5.0)
Alkaline Phosphatase: 42 U/L (ref 38–126)
Bilirubin, Direct: <0.1 mg/dL (ref 0.0–0.2)
Total Bilirubin: 0.7 mg/dL (ref 0.3–1.2)
Total Protein: 6.7 g/dL (ref 6.5–8.1)

## 2021-08-08 LAB — SURGICAL PATHOLOGY

## 2021-08-08 LAB — HCG, QUANTITATIVE, PREGNANCY: hCG, Beta Chain, Quant, S: 748 m[IU]/mL — ABNORMAL HIGH (ref <5)

## 2021-08-08 MED ORDER — OXYCODONE HCL 10 MG PO TABS: ORAL_TABLET | ORAL | 0 refills | Status: DC

## 2021-08-08 MED ORDER — LACTATED RINGERS IV SOLN: INTRAVENOUS | Status: DC

## 2021-08-08 MED ORDER — ONDANSETRON HCL 4 MG/2ML IJ SOLN
4.0000 mg | Freq: Once | INTRAMUSCULAR | Status: AC
  Administered 2021-08-08: 4 mg via INTRAVENOUS
  Filled 2021-08-08: qty 2

## 2021-08-08 MED ORDER — OXYCODONE HCL 7.5 MG PO TABS: ORAL_TABLET | ORAL | 0 refills | Status: DC

## 2021-08-08 MED ORDER — IOHEXOL 300 MG/ML  SOLN
100.0000 mL | Freq: Once | INTRAMUSCULAR | Status: AC | PRN
  Administered 2021-08-08: 100 mL via INTRAVENOUS

## 2021-08-08 MED ORDER — IBUPROFEN 800 MG PO TABS: 800.0000 mg | ORAL_TABLET | Freq: Three times a day (TID) | ORAL | 0 refills | Status: DC | PRN

## 2021-08-08 MED ORDER — MORPHINE SULFATE (PF) 4 MG/ML IV SOLN
4.0000 mg | Freq: Once | INTRAVENOUS | Status: AC
  Administered 2021-08-08: 4 mg via INTRAVENOUS
  Filled 2021-08-08: qty 1

## 2021-08-08 MED ORDER — LACTATED RINGERS IV BOLUS
1000.0000 mL | Freq: Once | INTRAVENOUS | Status: AC
  Administered 2021-08-08: 1000 mL via INTRAVENOUS

## 2021-08-08 NOTE — Telephone Encounter (Addendum)
Patient transferred by front desk. She had robotic surgery for ectopic pregnancy 08/06/21 w/Dr. Jerene Pitch. She has been/is taking pain meds, Tylenol, Ibuprofen and Oxycodone as scheduled. She is still in pain and is nauseas/unable to eat. States she doesn't feel any better. She states she was advised to elevate her feet and she is about to try that. I advised to report to ED for evaluation of pain. Dr. Jerene Pitch is on back up call and currently in the OR on a emergency case. She reports she can not go to ED right away. Her husband is at work with the car. She will try to reach him. She has some nausea candies that she will also try. Also advised can try heating pad while she is waiting for a ride to hospital. ?

## 2021-08-08 NOTE — Discharge Instructions (Addendum)
I have a crease your medication strength.  Please take the Motrin 800 mg 3 times a day.  Take it with food.  Use the oxycodone 10mg  1 pill 4 times a day.  Be careful the oxycodone can make you constipated.  It can also make you woozy.  Do not fall.  Do not drive on it.  If the police catch you on it you will be considered an impaired driver.  You can continue the Tylenol as you have been.  Please follow-up with Dr. office in the next few days.  Return here for any lightheadedness or fever vomiting or worsening pain.  Right now the ultrasound CT scan and blood work looked okay. ?

## 2021-08-08 NOTE — ED Triage Notes (Signed)
Pt comes pov after having ectopic surgery Sunday. Pt had stopped bleeding then started again this morning with severe pain to her lower abd and her lower back. Took her tylenol and her IR oxy with no relief.  ?

## 2021-08-08 NOTE — Telephone Encounter (Signed)
Molli Knock- they will hopefully do a CT scan in the ED- thank you

## 2021-08-08 NOTE — ED Notes (Signed)
Pt transported to US

## 2021-08-09 NOTE — ED Provider Notes (Signed)
? ?[facility] ?Provider Note ? ? ? Event Date/Time  ? First MD Initiated Contact with Patient 08/08/21 1529   ?  (approximate) ? ? ?History  ? ?Post-op Follow-up ? ? ?HPI ? ?Jasmine Price is a 26 y.o. female who had laparoscopic surgery for the removal of a coronal ectopic pregnancy on Saturday this past weekend.  She complains of increasing pain in her belly radiating up to her back into the shoulders bilaterally.  She also complains of passing clots. ? ?  ? ? ?Physical Exam  ? ?Triage Vital Signs: ?ED Triage Vitals [08/08/21 1354]  ?Enc Vitals Group  ?   BP (!) 142/94  ?   Pulse Rate (!) 114  ?   Resp 18  ?   Temp 98.9 ?F (37.2 ?C)  ?   Temp Source Oral  ?   SpO2 95 %  ?   Weight   ?   Height   ?   Head Circumference   ?   Peak Flow   ?   Pain Score 9  ?   Pain Loc   ?   Pain Edu?   ?   Excl. in GC?   ? ? ?Most recent vital signs: ?Vitals:  ? 08/08/21 1354 08/08/21 1924  ?BP: (!) 142/94 138/90  ?Pulse: (!) 114 98  ?Resp: 18 18  ?Temp: 98.9 ?F (37.2 ?C)   ?SpO2: 95% 99%  ? ? ? ?General: Awake, alert, initially in pain and uncomfortable later after for morphine looks very comfortable.  Heart rate goes back down to normal. ?CV:  Good peripheral perfusion.  Heart regular rate and rhythm no audible murmurs ?Resp:  Normal effort.  Lungs are clear ?Abd:  No distention.  Minimal tenderness to palpation areas where the laparoscope was inserted look okay. ?Extremities no edema ? ? ?ED Results / Procedures / Treatments  ? ?Labs ?(all labs ordered are listed, but only abnormal results are displayed) ?Labs Reviewed  ?CBC - Abnormal; Notable for the following components:  ?    Result Value  ? WBC 12.8 (*)   ? All other components within normal limits  ?BASIC METABOLIC PANEL - Abnormal; Notable for the following components:  ? Calcium 8.4 (*)   ? All other components within normal limits  ?HCG, QUANTITATIVE, PREGNANCY - Abnormal; Notable for the following components:  ? hCG, Beta Chain, Quant, S 748  (*)   ? All other components within normal limits  ?URINALYSIS, ROUTINE W REFLEX MICROSCOPIC - Abnormal; Notable for the following components:  ? Color, Urine STRAW (*)   ? APPearance CLEAR (*)   ? Specific Gravity, Urine 1.038 (*)   ? Hgb urine dipstick SMALL (*)   ? Leukocytes,Ua SMALL (*)   ? All other components within normal limits  ?CBC WITH DIFFERENTIAL/PLATELET - Abnormal; Notable for the following components:  ? WBC 12.3 (*)   ? Neutro Abs 10.1 (*)   ? All other components within normal limits  ?HEPATIC FUNCTION PANEL  ? ? ? ?EKG ? ? ? ? ?RADIOLOGY ?CT read by radiology films reviewed by me show no acute pathology there is a little bit of swelling around one of the laparoscopic wounds in the abdominal wall ?Ultrasound of the pelvis read by radiology reviewed by me shows no acute pathology ? ?PROCEDURES: ? ?Critical Care performed:  ? ?Procedures ? ? ?MEDICATIONS ORDERED IN ED: ?Medications  ?lactated ringers infusion (has no administration in time range)  ?morphine (PF) 4 MG/ML  injection 4 mg (4 mg Intravenous Given 08/08/21 1539)  ?ondansetron (ZOFRAN) injection 4 mg (4 mg Intravenous Given 08/08/21 1539)  ?lactated ringers bolus 1,000 mL (0 mLs Intravenous Stopped 08/08/21 1630)  ?iohexol (OMNIPAQUE) 300 MG/ML solution 100 mL (100 mLs Intravenous Contrast Given 08/08/21 1634)  ? ? ? ?IMPRESSION / MDM / ASSESSMENT AND PLAN / ED COURSE  ?I reviewed the triage vital signs and the nursing notes. ?Discussed in detail with Dr. Gaynelle Arabian.  Patient's hemoglobin is somewhat lower than it was presurgery but the patient has not passed any clots for about the last hour here in the hospital.  She looks well and is stable.  Her heart rate is come down.  Dr. Gaynelle Arabian feels she is okay to follow-up in the office.  Patient understands need to return for increasing pain fever vomiting or feeling sicker or lightheaded or having heavier bleeding. ? ? ? ?  ? ? ?FINAL CLINICAL IMPRESSION(S) / ED DIAGNOSES  ? ?Final diagnoses:   ?Generalized abdominal pain  ? ? ? ?Rx / DC Orders  ? ?ED Discharge Orders   ? ?      Ordered  ?  oxyCODONE HCl 7.5 MG TABS  Status:  Discontinued       ? 08/08/21 2002  ?  ibuprofen (ADVIL) 800 MG tablet  Every 8 hours PRN       ? 08/08/21 2002  ?  Oxycodone HCl 10 MG TABS       ? 08/08/21 2012  ? ?  ?  ? ?  ? ? ? ?Note:  This document was prepared using Dragon voice recognition software and may include unintentional dictation errors. ?  ?Arnaldo Natal, MD ?08/09/21 0010 ? ?

## 2021-08-12 NOTE — Anesthesia Postprocedure Evaluation (Signed)
Anesthesia Post Note ? ?Patient: Jasmine Price ? ?Procedure(s) Performed: XI ROBOTIC ASSISTED  Removal of Cornual Pregnancy (Left: Abdomen) ? ?Patient location during evaluation: PACU ?Anesthesia Type: General ?Level of consciousness: awake and alert ?Pain management: pain level controlled ?Vital Signs Assessment: post-procedure vital signs reviewed and stable ?Respiratory status: spontaneous breathing, nonlabored ventilation, respiratory function stable and patient connected to nasal cannula oxygen ?Cardiovascular status: blood pressure returned to baseline and stable ?Postop Assessment: no apparent nausea or vomiting ?Anesthetic complications: no ? ? ?No notable events documented. ? ? ?Last Vitals:  ?Vitals:  ? 08/06/21 2230 08/06/21 2256  ?BP: 129/73 118/69  ?Pulse: (!) 121   ?Resp: 20   ?Temp:  36.7 ?C  ?SpO2: 94%   ?  ?Last Pain:  ?Vitals:  ? 08/06/21 2256  ?TempSrc:   ?PainSc: 4   ? ? ?  ?  ?  ?  ?  ?  ? ?Karleen Hampshire ? ? ? ? ?

## 2021-08-14 ENCOUNTER — Encounter: Payer: Self-pay | Admitting: Obstetrics and Gynecology

## 2021-08-14 ENCOUNTER — Ambulatory Visit (INDEPENDENT_AMBULATORY_CARE_PROVIDER_SITE_OTHER): Payer: Medicaid Other | Admitting: Obstetrics and Gynecology

## 2021-08-14 VITALS — BP 120/80 | Ht 61.5 in | Wt 184.0 lb

## 2021-08-14 DIAGNOSIS — Z4889 Encounter for other specified surgical aftercare: Secondary | ICD-10-CM

## 2021-08-14 NOTE — Progress Notes (Signed)
?  Postoperative Follow-up ?Patient presents post op from  Robotic assisted laparoscopic left wedge resection of ectopic pregnancy for  cornual ectopic pregnancy , 1 week ago. ? ?Subjective: ?She reports that since the surgery she has been doing well. She went back to the ER for severe pain but evaluation was normal. She was given an increased narcotic dose which has helped. She is still using her opioid medication every 6 hours. She has had normal bowel movements. She is urinating. She had vaginal bleeding initially which has improved. She is tolerating a normal diet. She works as a Research scientist (medical). She is not yet feeling up for listing 10 lbs which she needs to do to in order to return to work. ? ?Objective: ?LMP  (LMP Unknown)  ?Physical Exam ?Constitutional:   ?   Appearance: Normal appearance. She is well-developed.  ?HENT:  ?   Head: Normocephalic and atraumatic.  ?Eyes:  ?   Extraocular Movements: Extraocular movements intact.  ?   Pupils: Pupils are equal, round, and reactive to light.  ?Neck:  ?   Thyroid: No thyromegaly.  ?Cardiovascular:  ?   Rate and Rhythm: Normal rate and regular rhythm.  ?   Heart sounds: Normal heart sounds.  ?Pulmonary:  ?   Effort: Pulmonary effort is normal.  ?   Breath sounds: Normal breath sounds.  ?Abdominal:  ?   General: Bowel sounds are normal. There is no distension.  ?   Palpations: Abdomen is soft. There is no mass.  ?   Comments: Incisions are clean dry and intact  ?Musculoskeletal:  ?   Cervical back: Neck supple.  ?Neurological:  ?   Mental Status: She is alert and oriented to person, place, and time.  ?Skin: ?   General: Skin is warm and dry.  ?Psychiatric:     ?   Behavior: Behavior normal.     ?   Thought Content: Thought content normal.     ?   Judgment: Judgment normal.  ?Vitals reviewed.  ? ? ?Assessment: ?s/p :  Robotic assisted laparoscopic left wedge resection of ectopic pregnancy stable ? ?Plan: ? ?Patient has done well after surgery with no apparent  complications.  ?  ?I have discussed the post-operative course to date, and the expected progress moving forward.  The patient understands what complications to be concerned about.  I will see the patient in routine follow up, or sooner if needed.   ? ?Activity plan: No heavy lifting. Follow up in 2-3 weeks. ? ?Advised she will need early monitoring with future pregnancies, Should not labor in the future because of the risk of uterine rupture and will likely need to delivery a few weeks before her due date.  ?Discussed that she should wait 3-6 months before trying to conceive.  ? ? ?Adelene Idler MD, ?[facility], [facility] ?08/14/2021 ?10:45 AM ? ? ? ?

## 2021-08-23 ENCOUNTER — Telehealth: Payer: Self-pay

## 2021-08-29 ENCOUNTER — Ambulatory Visit: Payer: Medicaid Other | Admitting: Family Medicine

## 2021-08-29 ENCOUNTER — Encounter: Payer: Self-pay | Admitting: Family Medicine

## 2021-08-29 VITALS — BP 120/80 | Ht 61.5 in | Wt 186.0 lb

## 2021-08-29 DIAGNOSIS — O00102 Left tubal pregnancy without intrauterine pregnancy: Secondary | ICD-10-CM

## 2021-08-29 DIAGNOSIS — M545 Low back pain, unspecified: Secondary | ICD-10-CM | POA: Diagnosis not present

## 2021-08-29 DIAGNOSIS — R300 Dysuria: Secondary | ICD-10-CM

## 2021-08-29 LAB — POCT URINALYSIS DIPSTICK
Bilirubin, UA: NEGATIVE
Glucose, UA: NEGATIVE
Ketones, UA: NEGATIVE
Nitrite, UA: NEGATIVE
Protein, UA: NEGATIVE
Spec Grav, UA: 1.01 (ref 1.010–1.025)
Urobilinogen, UA: 1 E.U./dL
pH, UA: 5 (ref 5.0–8.0)

## 2021-08-29 MED ORDER — NITROFURANTOIN MONOHYD MACRO 100 MG PO CAPS: 100.0000 mg | ORAL_CAPSULE | Freq: Two times a day (BID) | ORAL | 0 refills | Status: DC

## 2021-08-29 NOTE — Progress Notes (Signed)
? ?  GYNECOLOGY PROBLEM  VISIT ENCOUNTER NOTE ? ?Subjective:  ? Jasmine Price is a 26 y.o. G15P2000 female here for a problem GYN visit.  Current complaints: none-- returned to work tomorrow. Reports sadness about loss of pregnancy and having complex emotions. She is worried about postpartum depression. Of note she reported self weaning off lexapro. She reports she was told she cannot delivery vaginally again and she is still processing this.   Denies abnormal vaginal bleeding, discharge, pelvic pain, problems with intercourse or other gynecologic concerns.  ?  ?Gynecologic History ?LMP - unknown, was pregnant, resolved 4/9 with surgery. ? ?Contraception: none ? ?Health Maintenance Due  ?Topic Date Due  ? COVID-19 Vaccine (1) Never done  ? HIV Screening  Never done  ? Hepatitis C Screening  Never done  ? TETANUS/TDAP  Never done  ? PAP-Cervical Cytology Screening  Never done  ? PAP SMEAR-Modifier  Never done  ? ? ?The following portions of the patient's history were reviewed and updated as appropriate: allergies, current medications, past family history, past medical history, past social history, past surgical history and problem list. ? ?Review of Systems ?Pertinent items are noted in HPI. ?  ?Objective:  ?BP 120/80   Ht 5' 1.5" (1.562 m)   Wt 186 lb (84.4 kg)   LMP  (LMP Unknown)   BMI 34.58 kg/m?  ?Gen: well appearing, NAD ?HEENT: no scleral icterus ?CV: RR ?Lung: Normal WOB ?Ext: warm well perfused ?Abd: incisions are well healed.  ? ? ?Assessment and Plan:  ? ?1. Acute bilateral low back pain without sciatica ?- POCT urinalysis dipstick ?- Urine Culture ? ?2. Dysuria ?- UTI sx currently ?- will empirically treat today and await culture ?-nitrofurantoin, macrocrystal-monohydrate, (MACROBID) 100 MG capsule; Take 1 capsule (100 mg total) by mouth 2 (two) times daily.  Dispense: 14 capsule; Refill: 0 ? ?3. Left tubal pregnancy without intrauterine pregnancy ?Cornual ectopic- doing well  postoperatively ?Confirmed that given type of surgery we do not recommend laboring ?Patient declines contraceptive method today  ?Recommended waiting minimum 3 months before trying to conceive ?Provided active listening about her pregnancy journey and mental health. Recommended seeking counseling and considering not have self weaning SSRI and discussing with mental health provider. Reviewed that it is normal to have acute grief reactions ? ?Please refer to After Visit Summary for other counseling recommendations.  ? ?No follow-ups on file. ? ?Federico Flake, MD, MPH, ABFM ?Attending Physician ?[facility] ? ?

## 2021-08-31 LAB — URINE CULTURE

## 2021-09-04 ENCOUNTER — Encounter: Payer: Self-pay | Admitting: Obstetrics and Gynecology

## 2021-09-06 ENCOUNTER — Encounter: Payer: Self-pay | Admitting: Certified Nurse Midwife

## 2022-02-06 ENCOUNTER — Emergency Department: Payer: Managed Care, Other (non HMO)

## 2022-02-06 ENCOUNTER — Encounter: Payer: Self-pay | Admitting: Emergency Medicine

## 2022-02-06 DIAGNOSIS — R0602 Shortness of breath: Secondary | ICD-10-CM | POA: Insufficient documentation

## 2022-02-06 DIAGNOSIS — R42 Dizziness and giddiness: Secondary | ICD-10-CM | POA: Insufficient documentation

## 2022-02-06 DIAGNOSIS — N390 Urinary tract infection, site not specified: Secondary | ICD-10-CM | POA: Diagnosis not present

## 2022-02-06 DIAGNOSIS — R000 Tachycardia, unspecified: Secondary | ICD-10-CM | POA: Insufficient documentation

## 2022-02-06 DIAGNOSIS — M545 Low back pain, unspecified: Secondary | ICD-10-CM | POA: Diagnosis not present

## 2022-02-06 DIAGNOSIS — R300 Dysuria: Secondary | ICD-10-CM | POA: Diagnosis present

## 2022-02-06 LAB — URINALYSIS, ROUTINE W REFLEX MICROSCOPIC
Bilirubin Urine: NEGATIVE
Glucose, UA: NEGATIVE mg/dL
Hgb urine dipstick: NEGATIVE
Ketones, ur: 5 mg/dL — AB
Nitrite: NEGATIVE
Protein, ur: NEGATIVE mg/dL
Specific Gravity, Urine: 1.031 — ABNORMAL HIGH (ref 1.005–1.030)
WBC, UA: >50 WBC/hpf — ABNORMAL HIGH (ref 0–5)
pH: 6 (ref 5.0–8.0)

## 2022-02-06 LAB — CBC WITH DIFFERENTIAL/PLATELET
Abs Immature Granulocytes: 0.02 10*3/uL (ref 0.00–0.07)
Basophils Absolute: 0 10*3/uL (ref 0.0–0.1)
Basophils Relative: 0 %
Eosinophils Absolute: 0.1 10*3/uL (ref 0.0–0.5)
Eosinophils Relative: 1 %
HCT: 40.2 % (ref 36.0–46.0)
Hemoglobin: 13.6 g/dL (ref 12.0–15.0)
Immature Granulocytes: 0 %
Lymphocytes Relative: 19 %
Lymphs Abs: 1.8 10*3/uL (ref 0.7–4.0)
MCH: 27.9 pg (ref 26.0–34.0)
MCHC: 33.8 g/dL (ref 30.0–36.0)
MCV: 82.5 fL (ref 80.0–100.0)
Monocytes Absolute: 0.6 10*3/uL (ref 0.1–1.0)
Monocytes Relative: 7 %
Neutro Abs: 6.8 10*3/uL (ref 1.7–7.7)
Neutrophils Relative %: 73 %
Platelets: 267 10*3/uL (ref 150–400)
RBC: 4.87 MIL/uL (ref 3.87–5.11)
RDW: 13 % (ref 11.5–15.5)
WBC: 9.3 10*3/uL (ref 4.0–10.5)
nRBC: 0 % (ref 0.0–0.2)

## 2022-02-06 LAB — BASIC METABOLIC PANEL WITH GFR
Anion gap: 9 (ref 5–15)
BUN: 12 mg/dL (ref 6–20)
CO2: 23 mmol/L (ref 22–32)
Calcium: 9.8 mg/dL (ref 8.9–10.3)
Chloride: 106 mmol/L (ref 98–111)
Creatinine, Ser: 0.85 mg/dL (ref 0.44–1.00)
GFR, Estimated: >60 mL/min
Glucose, Bld: 109 mg/dL — ABNORMAL HIGH (ref 70–99)
Potassium: 3.3 mmol/L — ABNORMAL LOW (ref 3.5–5.1)
Sodium: 138 mmol/L (ref 135–145)

## 2022-02-06 LAB — TROPONIN I (HIGH SENSITIVITY)
Troponin I (High Sensitivity): 2 ng/L (ref <18)
Troponin I (High Sensitivity): <2 ng/L

## 2022-02-06 LAB — POC URINE PREG, ED: Preg Test, Ur: NEGATIVE

## 2022-02-06 NOTE — ED Triage Notes (Signed)
Pt presents via POV with complaints of dizziness for the last week around the same time she had her Zoloft dose increased. She endorses SOB & mild CP when taking a deep breath over the last week. Denies vision changes, LOC, falls, nor fevers.

## 2022-02-06 NOTE — ED Provider Triage Note (Signed)
  Emergency Medicine Provider Triage Evaluation Note  Jasmine Price , a 26 y.o.female,  was evaluated in triage.  Pt complains of shortness of breath.  She states that she has been feeling short of breath intermittently this past week, particular worse today.  She states she gets intermittent sharp chest pains as well.  She says that she feels shaky and feel like she is about to pass out today.  She believes that this may be related to the Zoloft that she started this past week.   Review of Systems  Positive: Chest pain, shortness of breath Negative: Denies fever, abdominal pain, vomiting  Physical Exam  There were no vitals filed for this visit. Gen:   Awake, no distress   Resp:  Normal effort  MSK:   Moves extremities without difficulty  Other:    Medical Decision Making  Given the patient's initial medical screening exam, the following diagnostic evaluation has been ordered. The patient will be placed in the appropriate treatment space, once one is available, to complete the evaluation and treatment. I have discussed the plan of care with the patient and I have advised the patient that an ED physician or mid-level practitioner will reevaluate their condition after the test results have been received, as the results may give them additional insight into the type of treatment they may need.    Diagnostics: Labs, EKG, CXR  Treatments: none immediately   Teodoro Spray, [place] 02/06/22 2011

## 2022-02-07 ENCOUNTER — Emergency Department
Admission: EM | Admit: 2022-02-07 | Discharge: 2022-02-07 | Disposition: A | Discharge status: Home / Self Care | Payer: Managed Care, Other (non HMO) | Attending: Emergency Medicine | Admitting: Emergency Medicine

## 2022-02-07 DIAGNOSIS — N390 Urinary tract infection, site not specified: Secondary | ICD-10-CM

## 2022-02-07 DIAGNOSIS — R42 Dizziness and giddiness: Secondary | ICD-10-CM

## 2022-02-07 MED ORDER — CEFDINIR 300 MG PO CAPS: 300.0000 mg | ORAL_CAPSULE | Freq: Two times a day (BID) | ORAL | 0 refills | Status: DC

## 2022-02-07 NOTE — ED Provider Notes (Signed)
[facility] Provider Note    Event Date/Time   First MD Initiated Contact with Patient 02/07/22 [phone]     (approximate)   History   Dizziness   HPI  Jasmine Price is a 26 y.o. female with history of depression, anxiety who presents to the emergency department with complaints of shortness of breath, dizziness, lower back pain, dysuria that started today.  No chest pain, fevers, cough, vomiting, diarrhea, abnormal vaginal bleeding.   No history of PE, DVT, exogenous estrogen use, recent fractures, surgery (other than surgery for ectopic pregnancy in April), trauma, hospitalization, prolonged travel or other immobilization. No lower extremity swelling or pain. No calf tenderness.   History provided by patient.    Past Medical History:  Diagnosis Date   Anxiety    Depression    Pregnancy, ectopic, cornual or cervical 08/06/2021   Left wedge resection of cornual ectopic pregnancy    Past Surgical History:  Procedure Laterality Date   TYMPANOSTOMY TUBE PLACEMENT Right    2013 and previously as a child   XI ROBOTIC ASSISTED SALPINGECTOMY Left 08/06/2021   Procedure: XI ROBOTIC ASSISTED  Removal of Cornual Pregnancy;  Surgeon: Natale Milch, MD;  Location: [facility] ORS;  Service: Gynecology;  Laterality: Left;    MEDICATIONS:  Prior to Admission medications   Medication Sig Start Date End Date Taking? Authorizing Provider  cefdinir (OMNICEF) 300 MG capsule Take 1 capsule (300 mg total) by mouth 2 (two) times daily. 02/07/22  Yes Rue Valladares, Layla Maw, DO  nitrofurantoin, macrocrystal-monohydrate, (MACROBID) 100 MG capsule Take 1 capsule (100 mg total) by mouth 2 (two) times daily. 08/29/21   Federico Flake, MD    Physical Exam   Triage Vital Signs: ED Triage Vitals  Enc Vitals Group     BP 02/06/22 2031 133/87     Pulse Rate 02/06/22 2031 (!) 59     Resp 02/06/22 2031 18     Temp 02/06/22 2031 98.4 F (36.9 C)     Temp Source 02/06/22  2031 Oral     SpO2 02/06/22 2031 93 %     Weight 02/06/22 2030 168 lb (76.2 kg)     Height 02/06/22 2030 5\' 1"  (1.549 m)     Head Circumference --      Peak Flow --      Pain Score 02/06/22 2035 3     Pain Loc --      Pain Edu? --      Excl. in GC? --     Most recent vital signs: Vitals:   02/07/22 0103 02/07/22 0525  BP: 120/87 115/81  Pulse: 77 69  Resp: 16 17  Temp: 98.6 F (37 C) 98.2 F (36.8 C)  SpO2: 99% 99%    CONSTITUTIONAL: Alert and oriented and responds appropriately to questions. Well-appearing; well-nourished HEAD: Normocephalic, atraumatic EYES: Conjunctivae clear, pupils appear equal, sclera nonicteric ENT: normal nose; moist mucous membranes NECK: Supple, normal ROM CARD: RRR; S1 and S2 appreciated; no murmurs, no clicks, no rubs, no gallops RESP: Normal chest excursion without splinting or tachypnea; breath sounds clear and equal bilaterally; no wheezes, no rhonchi, no rales, no hypoxia or respiratory distress, speaking full sentences ABD/GI: Normal bowel sounds; non-distended; soft, non-tender, no rebound, no guarding, no peritoneal signs BACK: The back appears normal, no CVA tenderness EXT: Normal ROM in all joints; no deformity noted, no edema; no cyanosis, no calf tenderness or calf swelling SKIN: Normal color for age and race; warm; no  rash on exposed skin NEURO: Moves all extremities equally, normal speech PSYCH: The patient's mood and manner are appropriate.   ED Results / Procedures / Treatments   LABS: (all labs ordered are listed, but only abnormal results are displayed) Labs Reviewed  BASIC METABOLIC PANEL - Abnormal; Notable for the following components:      Result Value   Potassium 3.3 (*)    Glucose, Bld 109 (*)    All other components within normal limits  URINALYSIS, ROUTINE W REFLEX MICROSCOPIC - Abnormal; Notable for the following components:   Color, Urine YELLOW (*)    APPearance HAZY (*)    Specific Gravity, Urine 1.031 (*)     Ketones, ur 5 (*)    Leukocytes,Ua MODERATE (*)    WBC, UA >50 (*)    Bacteria, UA RARE (*)    All other components within normal limits  URINE CULTURE  CBC WITH DIFFERENTIAL/PLATELET  POC URINE PREG, ED  TROPONIN I (HIGH SENSITIVITY)  TROPONIN I (HIGH SENSITIVITY)     EKG:  EKG Interpretation  Date/Time:  Tuesday February 06 2022 20:34:31 EDT Ventricular Rate:  103 PR Interval:  128 QRS Duration: 78 QT Interval:  334 QTC Calculation: 437 R Axis:   67 Text Interpretation: Sinus tachycardia T wave abnormality, consider inferior ischemia T wave abnormality, consider anterior ischemia Abnormal ECG No previous ECGs available Confirmed by Rochele Raring [phone]) on 02/07/2022 5:13:25 AM         RADIOLOGY: My personal review and interpretation of imaging: Chest x-ray clear.  I have personally reviewed all radiology reports.   DG Chest 2 View  Result Date: 02/06/2022 CLINICAL DATA:  Chest pain and shortness of breath. EXAM: CHEST - 2 VIEW COMPARISON:  Radiograph 09/30/2014 FINDINGS: The cardiomediastinal contours are normal. The lungs are clear. Pulmonary vasculature is normal. No consolidation, pleural effusion, or pneumothorax. No acute osseous abnormalities are seen. Left nipple piercing. IMPRESSION: Negative radiographs of the chest. Electronically Signed   By: Narda Rutherford M.D.   On: 02/06/2022 20:52     PROCEDURES:  Critical Care performed: No     Procedures    IMPRESSION / MDM / ASSESSMENT AND PLAN / ED COURSE  I reviewed the triage vital signs and the nursing notes.    Patient here with multiple complaints.  Complains of shortness of breath, dizziness, lower back pain, dysuria.    DIFFERENTIAL DIAGNOSIS (includes but not limited to):   UTI, dehydration, anemia, electrolyte derangement, low suspicion for ACS, PE, dissection, pneumonia, pneumothorax, CHF   Patient's presentation is most consistent with acute presentation with potential threat to life  or bodily function.   PLAN: Patient's labs show no leukocytosis.  Normal hemoglobin.  Normal electrolytes and renal function.  Urine does appear grossly infected.  Culture pending.  Pregnancy test is negative today.  Troponin x2 negative.  Chest x-ray reviewed and interpreted by myself and the radiologist and shows no acute abnormality.  EKG does show some nonspecific T wave abnormalities, flattening but no old for comparison.  Vital signs reassuring here.  She is PERC negative based on documented vital signs.  She did have surgery in April but no recent surgery in the past 1 to 2 months.  Will discharge on antibiotics for her UTI.  Recommended increase fluid intake.  Discussed return precautions and recommended follow-up with her PCP.   MEDICATIONS GIVEN IN ED: Medications - No data to display   ED COURSE:  At this time, I do not feel there  is any life-threatening condition present. I reviewed all nursing notes, vitals, pertinent previous records.  All lab and urine results, EKGs, imaging ordered have been independently reviewed and interpreted by myself.  I reviewed all available radiology reports from any imaging ordered this visit.  Based on my assessment, I feel the patient is safe to be discharged home without further emergent workup and can continue workup as an outpatient as needed. Discussed all findings, treatment plan as well as usual and customary return precautions.  They verbalize understanding and are comfortable with this plan.  Outpatient follow-up has been provided as needed.  All questions have been answered.    CONSULTS: No admission required at this time.  Patient well-appearing, nontoxic, afebrile with reassuring vital signs and work-up today.  No signs of sepsis.  Low suspicion for ACS, PE.   OUTSIDE RECORDS REVIEWED: Reviewed patient's previous admission to the hospital for ectopic pregnancy in April 2023.       FINAL CLINICAL IMPRESSION(S) / ED DIAGNOSES   Final  diagnoses:  Acute UTI  Dizziness     Rx / DC Orders   ED Discharge Orders          Ordered    cefdinir (OMNICEF) 300 MG capsule  2 times daily        02/07/22 0514             Note:  This document was prepared using Dragon voice recognition software and may include unintentional dictation errors.   Huberta Tompkins, Delice Bison, DO 02/07/22 [phone]

## 2022-02-08 LAB — URINE CULTURE: Culture: 50000 — AB

## 2022-02-15 ENCOUNTER — Other Ambulatory Visit: Payer: Self-pay | Admitting: Nurse Practitioner

## 2022-02-15 DIAGNOSIS — R319 Hematuria, unspecified: Secondary | ICD-10-CM

## 2022-02-26 ENCOUNTER — Ambulatory Visit
Admission: RE | Admit: 2022-02-26 | Discharge: 2022-02-26 | Disposition: A | Discharge status: Home / Self Care | Payer: Medicaid Other | Source: Ambulatory Visit | Attending: Nurse Practitioner | Admitting: Nurse Practitioner

## 2022-02-26 DIAGNOSIS — R319 Hematuria, unspecified: Secondary | ICD-10-CM

## 2022-04-09 ENCOUNTER — Ambulatory Visit: Payer: Managed Care, Other (non HMO) | Attending: Internal Medicine | Admitting: Physical Therapy

## 2022-04-09 ENCOUNTER — Encounter: Payer: Self-pay | Admitting: Physical Therapy

## 2022-04-09 DIAGNOSIS — R102 Pelvic and perineal pain: Secondary | ICD-10-CM | POA: Diagnosis present

## 2022-04-09 DIAGNOSIS — R278 Other lack of coordination: Secondary | ICD-10-CM

## 2022-04-09 DIAGNOSIS — M6281 Muscle weakness (generalized): Secondary | ICD-10-CM | POA: Diagnosis present
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
## 2022-04-16 ENCOUNTER — Ambulatory Visit: Payer: Managed Care, Other (non HMO) | Admitting: Obstetrics & Gynecology

## 2022-04-16 ENCOUNTER — Ambulatory Visit: Payer: Managed Care, Other (non HMO) | Admitting: Physical Therapy

## 2022-04-16 ENCOUNTER — Encounter: Payer: Self-pay | Admitting: Physical Therapy

## 2022-04-16 ENCOUNTER — Encounter: Payer: Self-pay | Admitting: Obstetrics & Gynecology

## 2022-04-16 VITALS — BP 127/89 | HR 82 | Ht 61.0 in | Wt 170.0 lb

## 2022-04-16 DIAGNOSIS — M6281 Muscle weakness (generalized): Secondary | ICD-10-CM

## 2022-04-16 DIAGNOSIS — R278 Other lack of coordination: Secondary | ICD-10-CM

## 2022-04-16 DIAGNOSIS — R102 Pelvic and perineal pain: Secondary | ICD-10-CM

## 2022-04-16 DIAGNOSIS — N939 Abnormal uterine and vaginal bleeding, unspecified: Secondary | ICD-10-CM | POA: Diagnosis not present
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
## 2022-04-16 NOTE — Progress Notes (Signed)
   Established Patient Office Visit  Subjective   Patient ID: Jasmine Price, female    DOB: [birth year]  Age: 26 y.o. MRN: [id number]  Chief Complaint  Patient presents with   Bartholin's Cyst    HPI   26 yo married P2 here for evaluation of a possible Bartholin's cyst on her left. She reports that it started around 3 weeks ago and has now resolved.  She is also concerned because her periods have been irregular since she had a robotic left salpingectomy for an ectopic pregnancy. She has still had bleeding every month, but the bleeding is random and only lasts for about 3 days. She went some months without having sex but she has recently started having sex again. She would be happy with a pregnancy and is not using contraception. She is bleeding today.  Objective:     BP 127/89   Pulse 82   Ht 5\' 1"  (1.549 m)   Wt 170 lb (77.1 kg)   LMP 04/13/2022   BMI 32.12 kg/m    Physical Exam   Well nourished, well hydrated White female, no apparent distress She is ambulating and conversing normally. EG- bloody from her period No cysts or other abnormality as of today  Assessment & Plan:  AUB- check TSH and she will make an appt to follow up Desire for pregnancy- I have rec'd that she start taking a PNV today to help prevent ONTDs  Problem List Items Addressed This Visit   None   No follow-ups on file.    04/15/2022, MD

## 2022-04-17 LAB — TSH+FREE T4
Free T4: 0.96 ng/dL (ref 0.82–1.77)
TSH: 1.54 u[IU]/mL (ref 0.450–4.500)

## 2022-04-24 ENCOUNTER — Ambulatory Visit: Payer: Managed Care, Other (non HMO) | Admitting: Physical Therapy

## 2022-05-01 ENCOUNTER — Encounter: Payer: Medicaid Other | Admitting: Physical Therapy

## 2022-05-07 ENCOUNTER — Encounter: Payer: Self-pay | Admitting: Physical Therapy

## 2022-05-07 ENCOUNTER — Ambulatory Visit: Payer: Managed Care, Other (non HMO) | Attending: Internal Medicine | Admitting: Physical Therapy

## 2022-05-07 DIAGNOSIS — R278 Other lack of coordination: Secondary | ICD-10-CM | POA: Diagnosis present

## 2022-05-07 DIAGNOSIS — M6281 Muscle weakness (generalized): Secondary | ICD-10-CM | POA: Insufficient documentation

## 2022-05-07 DIAGNOSIS — R102 Pelvic and perineal pain: Secondary | ICD-10-CM | POA: Insufficient documentation
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
## 2022-05-14 ENCOUNTER — Encounter: Payer: Self-pay | Admitting: Physical Therapy

## 2022-05-14 ENCOUNTER — Ambulatory Visit: Payer: Managed Care, Other (non HMO) | Admitting: Physical Therapy

## 2022-05-14 DIAGNOSIS — R278 Other lack of coordination: Secondary | ICD-10-CM | POA: Diagnosis not present

## 2022-05-14 DIAGNOSIS — M6281 Muscle weakness (generalized): Secondary | ICD-10-CM

## 2022-05-14 DIAGNOSIS — R102 Pelvic and perineal pain: Secondary | ICD-10-CM
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
## 2022-05-21 ENCOUNTER — Ambulatory Visit: Payer: Medicaid Other | Admitting: Obstetrics & Gynecology

## 2022-05-21 ENCOUNTER — Ambulatory Visit: Payer: Managed Care, Other (non HMO) | Admitting: Physical Therapy

## 2022-05-21 NOTE — Progress Notes (Incomplete)
    GYNECOLOGY PROGRESS NOTE  Subjective:    Patient ID: Dorette Grate, female    DOB: 1995/10/01, 27 y.o.   MRN: [id number]  HPI  Patient is a 27 y.o. G5P2000 female who presents for abnormal uterine bleeding  {Common ambulatory SmartLinks:19316}  Review of Systems {ros; complete:30496}   Objective:   There were no vitals taken for this visit. There is no height or weight on file to calculate BMI. General appearance: {general exam:16600} Abdomen: {abdominal exam:16834} Pelvic: {pelvic exam:16852::"cervix normal in appearance","external genitalia normal","no adnexal masses or tenderness","no cervical motion tenderness","rectovaginal septum normal","uterus normal size, shape, and consistency","vagina normal without discharge"} Extremities: {extremity exam:5109} Neurologic: {neuro exam:17854}   Assessment:   No diagnosis found.   Plan:   There are no diagnoses linked to this encounter.

## 2022-05-28 ENCOUNTER — Ambulatory Visit: Payer: Managed Care, Other (non HMO) | Admitting: Physical Therapy

## 2022-05-28 ENCOUNTER — Encounter: Payer: Self-pay | Admitting: Physical Therapy

## 2022-05-28 DIAGNOSIS — R278 Other lack of coordination: Secondary | ICD-10-CM

## 2022-05-28 DIAGNOSIS — M6281 Muscle weakness (generalized): Secondary | ICD-10-CM

## 2022-05-28 DIAGNOSIS — R102 Pelvic and perineal pain: Secondary | ICD-10-CM
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
## 2022-06-04 ENCOUNTER — Encounter: Payer: Medicaid Other | Admitting: Physical Therapy

## 2022-07-02 ENCOUNTER — Encounter: Payer: Self-pay | Admitting: Physical Therapy

## 2022-07-17 ENCOUNTER — Encounter: Payer: Managed Care, Other (non HMO) | Admitting: Physical Therapy

## 2022-07-31 ENCOUNTER — Other Ambulatory Visit: Payer: Managed Care, Other (non HMO)

## 2022-07-31 ENCOUNTER — Other Ambulatory Visit: Payer: Self-pay

## 2022-07-31 ENCOUNTER — Telehealth: Payer: Self-pay

## 2022-07-31 DIAGNOSIS — O0911 Supervision of pregnancy with history of ectopic or molar pregnancy, first trimester: Secondary | ICD-10-CM

## 2022-07-31 DIAGNOSIS — Z3481 Encounter for supervision of other normal pregnancy, first trimester: Secondary | ICD-10-CM

## 2022-07-31 DIAGNOSIS — Z369 Encounter for antenatal screening, unspecified: Secondary | ICD-10-CM

## 2022-07-31 NOTE — Telephone Encounter (Addendum)
The patient is scheduled for Friday, 4/5 at 8:15 it's ok per Missy. Per ON call Missy to advise the patient if she has any bleeding or pain to follow up with the ER. Patient is aware.

## 2022-07-31 NOTE — Telephone Encounter (Signed)
Pt calling; tx from schedulers; hx of ectopic; LMP 05/17/22; pt wants to be sure of baby's placement; adv I would order dating u/s and send high priority msg to schedulers.

## 2022-08-01 ENCOUNTER — Ambulatory Visit (INDEPENDENT_AMBULATORY_CARE_PROVIDER_SITE_OTHER): Payer: Managed Care, Other (non HMO)

## 2022-08-01 VITALS — Wt 178.0 lb

## 2022-08-01 DIAGNOSIS — Z3480 Encounter for supervision of other normal pregnancy, unspecified trimester: Secondary | ICD-10-CM | POA: Insufficient documentation

## 2022-08-01 DIAGNOSIS — Z131 Encounter for screening for diabetes mellitus: Secondary | ICD-10-CM

## 2022-08-01 DIAGNOSIS — Z369 Encounter for antenatal screening, unspecified: Secondary | ICD-10-CM

## 2022-08-01 DIAGNOSIS — Z3689 Encounter for other specified antenatal screening: Secondary | ICD-10-CM

## 2022-08-01 NOTE — Patient Instructions (Signed)
Cystic Fibrosis, Adult Cystic fibrosis (CF) is an inherited disease that mainly affects the lungs and digestive system. CF may also affect other areas of the body, such as skin, pancreas, liver, intestines, sinuses, and sex organs. The lungs can be easily blocked with thick and sticky mucus, making it hard to breathe. The thick and sticky mucus also increases a person's risk of frequent lung infections. Thick fluid in the pancreas and sticky mucus in the intestines prevent enzymes from properly breaking down food (digestion), making it difficult to absorb nutrients properly. Cystic fibrosis is a lifelong condition. There is no cure at this time. Proper care and treatment can help manage the effects of CF so you can feel good and lead an active life. What are the causes? Cystic fibrosis is caused by a defective gene that is passed from parent to child. This gene leads to a change in a protein that affects all the cells in your body, including the cells that make mucus and sweat. It is possible to have the gene without developing the disease. To develop CF, you must inherit one copy of the defective gene from each parent. What increases the risk? CF is an inherited disease. You are more likely to develop this condition if: You have a parent or sibling with CF. You have parents who both have the gene that causes CF. You are White (Caucasian). What are the signs or symptoms? Symptoms of this condition include: Ongoing (chronic) coughing or wheezing. Frequent lung or sinus infections. Blockage of the bowel (intestines). Frequent diarrhea, constipation, or pain in the abdomen. Trouble gaining weight. Salty skin. Club-shaped fingers or toes. How is this diagnosed? Cystic fibrosis is usually diagnosed during early childhood, but sometimes diagnosis happens later in life. Adults diagnosed later in life usually have a milder form of the disease. Diagnosis may include tests such as: Blood tests to check  for the defective gene that is associated with CF. A sweat test, also called a sweat chloride test. Sweat is collected from your skin. The level of chloride is measured. CF is diagnosed if chloride is high. This is the standard test for diagnosing cystic fibrosis. How is this treated? There is no cure for cystic fibrosis, but treatment can help you manage symptoms. Treatment may include: Mucus clearing treatments, such as chest percussion, to loosen mucus in the lungs in order to prevent infection and shortness of breath. Inhaled medicines to thin the sticky mucus or open the lungs to make breathing easier. Antibiotic medicines to prevent or treat infections. Pills (enzyme capsules) to help with digestion of food. Surgery to remove nasal polyps, relieve a digestive system blockage, or perform a lung or liver transplant. Follow these instructions at home: Medicines Take over-the-counter and prescription medicines only as told by your health care provider. Take inhaled medicine as told by your health care provider. If you were prescribed an antibiotic, take it as told by your health care provider. Do not stop taking the antibiotic even if you start to feel better. Eating and drinking Follow instructions from your health care provider about eating and drinking. You may need to: Eat a high-calorie, high-fat diet to stay at a healthy weight. Eat a high-salt (high-sodium) diet or take salt supplements. Take nutritional supplements only as recommended by your health care provider. Drink lots of fluid to help thin the mucus in the body.  Lifestyle  Do not use any products that contain nicotine or tobacco. These products include cigarettes, chewing tobacco, and vaping  devices, such as e-cigarettes. If you need help quitting, ask your health care provider. Avoid secondhand smoke. Exercise as told by your health care provider. Try to be as physically active as possible. General instructions Follow  recommended treatments to clear mucus as told by your health care provider. Stay up to date on your vaccinations to help prevent infection. Make sure you and all people in your household wash hands often with soap and water for at least 20 seconds. Doing this lowers the chance of infection. If soap and water are not available, use hand sanitizer. Consider joining a support group for adults with CF. Keep all follow-up visits. This is important. Where to find more information Cystic Fibrosis Foundation: http://www.anderson-foster.com/ American Lung Association (ALA): www.lung.org American Thoracic Society (ATS): CriticalGas.be Contact a health care provider if: You do not feel like eating or drinking, or you are losing weight. You feel tired or do not have energy. You have loose stool (diarrhea), or you have trouble passing stool (constipation). You have a fever. Get help right away if: You have trouble breathing. You have a lot more mucus than usual or your cough is worse. You have new or severe pain in your abdomen. You have chest pain. You are dizzy or you faint. You have yellowing skin or eyes (jaundice). You throw up dark green or yellow fluid. These symptoms may be an emergency. Get help right away. Call 911. Do not wait to see if the symptoms will go away. Do not drive yourself to the hospital. Summary Cystic fibrosis (CF) is a genetic disease that mainly affects the lungs and digestive system. Cystic fibrosis is a lifelong condition. There is no cure at this time. Proper care and treatment can help manage the effects of CF so you can feel good and lead an active life. Do not smoke or use any products that contain nicotine or tobacco. If you need help quitting, ask your health care provider. This information is not intended to replace advice given to you by your health care provider. Make sure you discuss any questions you have with your health care provider. Document Revised: 11/03/2020 Document  Reviewed: 11/03/2020 Elsevier Patient Education  [place] of Pregnancy  The second trimester of pregnancy is from week 13 through week 27. This is months 4 through 6 of pregnancy. The second trimester is often a time when you feel your best. Your body has adjusted to being pregnant, and you begin to feel better physically. During the second trimester: Morning sickness has lessened or stopped completely. You may have more energy. You may have an increase in appetite. The second trimester is also a time when the unborn baby (fetus) is growing rapidly. At the end of the sixth month, the fetus may be up to 12 inches long and weigh about 1 pounds. You will likely begin to feel the baby move (quickening) between 16 and 20 weeks of pregnancy. Body changes during your second trimester Your body continues to go through many changes during your second trimester. The changes vary and generally return to normal after the baby is born. Physical changes Your weight will continue to increase. You will notice your lower abdomen bulging out. You may begin to get stretch marks on your hips, abdomen, and breasts. Your breasts will continue to grow and to become tender. Dark spots or blotches (chloasma or mask of pregnancy) may develop on your face. A dark line from your belly button to the pubic area (linea  nigra) may appear. You may have changes in your hair. These can include thickening of your hair, rapid growth, and changes in texture. Some people also have hair loss during or after pregnancy, or hair that feels dry or thin. Health changes You may develop headaches. You may have heartburn. You may develop constipation. You may develop hemorrhoids or swollen, bulging veins (varicose veins). Your gums may bleed and may be sensitive to brushing and flossing. You may urinate more often because the fetus is pressing on your bladder. You may have back pain. This is caused  by: Weight gain. Pregnancy hormones that are relaxing the joints in your pelvis. A shift in weight and the muscles that support your balance. Follow these instructions at home: Medicines Follow your health care provider's instructions regarding medicine use. Specific medicines may be either safe or unsafe to take during pregnancy. Do not take any medicines unless approved by your health care provider. Take a prenatal vitamin that contains at least 600 micrograms (mcg) of folic acid. Eating and drinking Eat a healthy diet that includes fresh fruits and vegetables, whole grains, good sources of protein such as meat, eggs, or tofu, and low-fat dairy products. Avoid raw meat and unpasteurized juice, milk, and cheese. These carry germs that can harm you and your baby. You may need to take these actions to prevent or treat constipation: Drink enough fluid to keep your urine pale yellow. Eat foods that are high in fiber, such as beans, whole grains, and fresh fruits and vegetables. Limit foods that are high in fat and processed sugars, such as fried or sweet foods. Activity Exercise only as directed by your health care provider. Most people can continue their usual exercise routine during pregnancy. Try to exercise for 30 minutes at least 5 days a week. Stop exercising if you develop contractions in your uterus. Stop exercising if you develop pain or cramping in the lower abdomen or lower back. Avoid exercising if it is very hot or humid or if you are at a high altitude. Avoid heavy lifting. If you choose to, you may have sex unless your health care provider tells you not to. Relieving pain and discomfort Wear a supportive bra to prevent discomfort from breast tenderness. Take warm sitz baths to soothe any pain or discomfort caused by hemorrhoids. Use hemorrhoid cream if your health care provider approves. Rest with your legs raised (elevated) if you have leg cramps or low back pain. If you develop  varicose veins: Wear support hose as told by your health care provider. Elevate your feet for 15 minutes, 3-4 times a day. Limit salt in your diet. Safety Wear your seat belt at all times when driving or riding in a car. Talk with your health care provider if someone is verbally or physically abusive to you. Lifestyle Do not use hot tubs, steam rooms, or saunas. Do not douche. Do not use tampons or scented sanitary pads. Avoid cat litter boxes and soil used by cats. These carry germs that can cause birth defects in the baby and possibly loss of the fetus by miscarriage or stillbirth. Do not use herbal remedies, alcohol, illegal drugs, or medicines that are not approved by your health care provider. Chemicals in these products can harm your baby. Do not use any products that contain nicotine or tobacco, such as cigarettes, e-cigarettes, and chewing tobacco. If you need help quitting, ask your health care provider. General instructions During a routine prenatal visit, your health care provider will do  a physical exam and other tests. He or she will also discuss your overall health. Keep all follow-up visits. This is important. Ask your health care provider for a referral to a local prenatal education class. Ask for help if you have counseling or nutritional needs during pregnancy. Your health care provider can offer advice or refer you to specialists for help with various needs. Where to find more information American Pregnancy Association: americanpregnancy.[place] and Gynecologists: PoolDevices.com.pt Office on [employer] Health: KeywordPortfolios.com.br Contact a health care provider if you have: A headache that does not go away when you take medicine. Vision changes or you see spots in front of your eyes. Mild pelvic cramps, pelvic pressure, or nagging pain in the abdominal area. Persistent nausea, vomiting, or diarrhea. A bad-smelling  vaginal discharge or foul-smelling urine. Pain when you urinate. Sudden or extreme swelling of your face, hands, ankles, feet, or legs. A fever. Get help right away if you: Have fluid leaking from your vagina. Have spotting or bleeding from your vagina. Have severe abdominal cramping or pain. Have difficulty breathing. Have chest pain. Have fainting spells. Have not felt your baby move for the time period told by your health care provider. Have new or increased pain, swelling, or redness in an arm or leg. Summary The second trimester of pregnancy is from week 13 through week 27 (months 4 through 6). Do not use herbal remedies, alcohol, illegal drugs, or medicines that are not approved by your health care provider. Chemicals in these products can harm your baby. Exercise only as directed by your health care provider. Most people can continue their usual exercise routine during pregnancy. Keep all follow-up visits. This is important. This information is not intended to replace advice given to you by your health care provider. Make sure you discuss any questions you have with your health care provider. Document Revised: 09/23/2019 Document Reviewed: 07/30/2019 Elsevier Patient Education  [place] of Pregnancy  The first trimester of pregnancy starts on the first day of your last menstrual period until the end of week 12. This is also called months 1 through 3 of pregnancy. Body changes during your first trimester Your body goes through many changes during pregnancy. The changes usually return to normal after your baby is born. Physical changes You may gain or lose weight. Your breasts may grow larger and hurt. The area around your nipples may get darker. Dark spots or blotches may develop on your face. You may have changes in your hair. Health changes You may feel like you might vomit (nauseous), and you may vomit. You may have heartburn. You may have  headaches. You may have trouble pooping (constipation). Your gums may bleed. Other changes You may get tired easily. You may pee (urinate) more often. Your menstrual periods will stop. You may not feel hungry. You may want to eat certain kinds of food. You may have changes in your emotions from day to day. You may have more dreams. Follow these instructions at home: Medicines Take over-the-counter and prescription medicines only as told by your doctor. Some medicines are not safe during pregnancy. Take a prenatal vitamin that contains at least 600 micrograms (mcg) of folic acid. Eating and drinking Eat healthy meals that include: Fresh fruits and vegetables. Whole grains. Good sources of protein, such as meat, eggs, or tofu. Low-fat dairy products. Avoid raw meat and unpasteurized juice, milk, and cheese. If you feel like you may vomit, or you vomit: Eat  4 or 5 small meals a day instead of 3 large meals. Try eating a few soda crackers. Drink liquids between meals instead of during meals. You may need to take these actions to prevent or treat trouble pooping: Drink enough fluids to keep your pee (urine) pale yellow. Eat foods that are high in fiber. These include beans, whole grains, and fresh fruits and vegetables. Limit foods that are high in fat and sugar. These include fried or sweet foods. Activity Exercise only as told by your doctor. Most people can do their usual exercise routine during pregnancy. Stop exercising if you have cramps or pain in your lower belly (abdomen) or low back. Do not exercise if it is too hot or too humid, or if you are in a place of great height (high altitude). Avoid heavy lifting. If you choose to, you may have sex unless your doctor tells you not to. Relieving pain and discomfort Wear a good support bra if your breasts are sore. Rest with your legs raised (elevated) if you have leg cramps or low back pain. If you have bulging veins (varicose  veins) in your legs: Wear support hose as told by your doctor. Raise your feet for 15 minutes, 3-4 times a day. Limit salt in your food. Safety Wear your seat belt at all times when you are in a car. Talk with your doctor if someone is hurting you or yelling at you. Talk with your doctor if you are feeling sad or have thoughts of hurting yourself. Lifestyle Do not use hot tubs, steam rooms, or saunas. Do not douche. Do not use tampons or scented sanitary pads. Do not use herbal medicines, illegal drugs, or medicines that are not approved by your doctor. Do not drink alcohol. Do not smoke or use any products that contain nicotine or tobacco. If you need help quitting, ask your doctor. Avoid cat litter boxes and soil that is used by cats. These carry germs that can cause harm to the baby and can cause a loss of your baby by miscarriage or stillbirth. General instructions Keep all follow-up visits. This is important. Ask for help if you need counseling or if you need help with nutrition. Your doctor can give you advice or tell you where to go for help. Visit your dentist. At home, brush your teeth with a soft toothbrush. Floss gently. Write down your questions. Take them to your prenatal visits. Where to find more information American Pregnancy Association: americanpregnancy.org [employer] of Obstetricians and Gynecologists: www.acog.org Office on Women's Health: KeywordPortfolios.com.br Contact a doctor if: You are dizzy. You have a fever. You have mild cramps or pressure in your lower belly. You have a nagging pain in your belly area. You continue to feel like you may vomit, you vomit, or you have watery poop (diarrhea) for 24 hours or longer. You have a bad-smelling fluid coming from your vagina. You have pain when you pee. You are exposed to a disease that spreads from person to person, such as chickenpox, measles, Zika virus, HIV, or hepatitis. Get help right away if: You  have spotting or bleeding from your vagina. You have very bad belly cramping or pain. You have shortness of breath or chest pain. You have any kind of injury, such as from a fall or a car crash. You have new or increased pain, swelling, or redness in an arm or leg. Summary The first trimester of pregnancy starts on the first day of your last menstrual period until  the end of week 12 (months 1 through 3). Eat 4 or 5 small meals a day instead of 3 large meals. Do not smoke or use any products that contain nicotine or tobacco. If you need help quitting, ask your doctor. Keep all follow-up visits. This information is not intended to replace advice given to you by your health care provider. Make sure you discuss any questions you have with your health care provider. Document Revised: 09/23/2019 Document Reviewed: 07/30/2019 Elsevier Patient Education  [place]. Commonly Asked Questions During Pregnancy  Cats: A parasite can be excreted in cat feces.  To avoid exposure you need to have another person empty the little box.  If you must empty the litter box you will need to wear gloves.  Wash your hands after handling your cat.  This parasite can also be found in raw or undercooked meat so this should also be avoided.  Colds, Sore Throats, Flu: Please check your medication sheet to see what you can take for symptoms.  If your symptoms are unrelieved by these medications please call the office.  Dental Work: Most any dental work Investment banker, corporate recommends is permitted.  X-rays should only be taken during the first trimester if absolutely necessary.  Your abdomen should be shielded with a lead apron during all x-rays.  Please notify your provider prior to receiving any x-rays.  Novocaine is fine; gas is not recommended.  If your dentist requires a note from [place] prior to dental work please call the office and we will provide one for you.  Exercise: Exercise is an important part of staying healthy  during your pregnancy.  You may continue most exercises you were accustomed to prior to pregnancy.  Later in your pregnancy you will most likely notice you have difficulty with activities requiring balance like riding a bicycle.  It is important that you listen to your body and avoid activities that put you at a higher risk of falling.  Adequate rest and staying well hydrated are a must!  If you have questions about the safety of specific activities ask your provider.    Exposure to Children with illness: Try to avoid obvious exposure; report any symptoms to [place] when noted,  If you have chicken pos, red measles or mumps, you should be immune to these diseases.   Please do not take any vaccines while pregnant unless you have checked with your OB provider.  Fetal Movement: After 28 weeks we recommend you do "kick counts" twice daily.  Lie or sit down in a calm quiet environment and count your baby movements "kicks".  You should feel your baby at least 10 times per hour.  If you have not felt 10 kicks within the first hour get up, walk around and have something sweet to eat or drink then repeat for an additional hour.  If count remains less than 10 per hour notify your provider.  Fumigating: Follow your pest control agent's advice as to how long to stay out of your home.  Ventilate the area well before re-entering.  Hemorrhoids:   Most over-the-counter preparations can be used during pregnancy.  Check your medication to see what is safe to use.  It is important to use a stool softener or fiber in your diet and to drink lots of liquids.  If hemorrhoids seem to be getting worse please call the office.   Hot Tubs:  Hot tubs Jacuzzis and saunas are not recommended while pregnant.  These increase your  internal body temperature and should be avoided.  Intercourse:  Sexual intercourse is safe during pregnancy as long as you are comfortable, unless otherwise advised by your provider.  Spotting may occur after  intercourse; report any bright red bleeding that is heavier than spotting.  Labor:  If you know that you are in labor, please go to the hospital.  If you are unsure, please call the office and let us help you decide what to do.  Lifting, straining, etc:  If your job requires heavy lifting or straining please check with your provider for any limitations.  Generally, you should not lift items heavier than that you can lift simply with your hands and arms (no back muscles)  Painting:  Paint fumes do not harm your pregnancy, but may make you ill and should be avoided if possible.  Latex or water based paints have less odor than oils.  Use adequate ventilation while painting.  Permanents & Hair Color:  Chemicals in hair dyes are not recommended as they cause increase hair dryness which can increase hair loss during pregnancy.  " Highlighting" and permanents are allowed.  Dye may be absorbed differently and permanents may not hold as well during pregnancy.  Sunbathing:  Use a sunscreen, as skin burns easily during pregnancy.  Drink plenty of fluids; avoid over heating.  Tanning Beds:  Because their possible side effects are still unknown, tanning beds are not recommended.  Ultrasound Scans:  Routine ultrasounds are performed at approximately 20 weeks.  You will be able to see your baby's general anatomy an if you would like to know the gender this can usually be determined as well.  If it is questionable when you conceived you may also receive an ultrasound early in your pregnancy for dating purposes.  Otherwise ultrasound exams are not routinely performed unless there is a medical necessity.  Although you can request a scan we ask that you pay for it when conducted because insurance does not cover " patient request" scans.  Work: If your pregnancy proceeds without complications you may work until your due date, unless your physician or employer advises otherwise.  Round Ligament Pain/Pelvic Discomfort:   Sharp, shooting pains not associated with bleeding are fairly common, usually occurring in the second trimester of pregnancy.  They tend to be worse when standing up or when you remain standing for long periods of time.  These are the result of pressure of certain pelvic ligaments called "round ligaments".  Rest, Tylenol and heat seem to be the most effective relief.  As the womb and fetus grow, they rise out of the pelvis and the discomfort improves.  Please notify the office if your pain seems different than that described.  It may represent a more serious condition.  Common Medications Safe in Pregnancy  Acne:      Constipation:  Benzoyl Peroxide     Colace  Clindamycin      Dulcolax Suppository  Topica Erythromycin     Fibercon  Salicylic Acid      Metamucil         Miralax AVOID:        Senakot   Accutane    Cough:  Retin-A       Cough Drops  Tetracycline      Phenergan w/ Codeine if Rx  Minocycline      Robitussin (Plain & DM)  Antibiotics:     Crabs/Lice:  Ceclor       RID  Cephalosporins  AVOID:  E-Mycins      Kwell  Keflex  Macrobid/Macrodantin   Diarrhea:  Penicillin      Kao-Pectate  Zithromax      Imodium AD         PUSH FLUIDS AVOID:       Cipro     Fever:  Tetracycline      Tylenol (Regular or Extra  Minocycline       Strength)  Levaquin      Extra Strength-Do not          Exceed 8 tabs/24 hrs Caffeine:        200mg /day (equiv. To 1 cup of coffee or  approx. 3 12 oz sodas)         Gas: Cold/Hayfever:       Gas-X  Benadryl      Mylicon  Claritin       Phazyme  **Claritin-D        Chlor-Trimeton    Headaches:  Dimetapp      ASA-Free Excedrin  Drixoral-Non-Drowsy     Cold Compress  Mucinex (Guaifenasin)     Tylenol (Regular or Extra  Sudafed/Sudafed-12 Hour     Strength)  **Sudafed PE Pseudoephedrine   Tylenol Cold & Sinus     Vicks Vapor Rub  Zyrtec  **AVOID if Problems With Blood Pressure         Heartburn: Avoid lying down for at least 1 hour  after meals  Aciphex      Maalox     Rash:  Milk of Magnesia     Benadryl    Mylanta       1% Hydrocortisone Cream  Pepcid  Pepcid Complete   Sleep Aids:  Prevacid      Ambien   Prilosec       Benadryl  Rolaids       Chamomile Tea  Tums (Limit 4/day)     Unisom         Tylenol PM         Warm milk-add vanilla or  Hemorrhoids:       Sugar for taste  Anusol/Anusol H.C.  (RX: Analapram 2.5%)  Sugar Substitutes:  Hydrocortisone OTC     Ok in moderation  Preparation H      Tucks        Vaseline lotion applied to tissue with wiping    Herpes:     Throat:  Acyclovir      Oragel  Famvir  Valtrex     Vaccines:         Flu Shot Leg Cramps:       *Gardasil  Benadryl      Hepatitis A         Hepatitis B Nasal Spray:       Pneumovax  Saline Nasal Spray     Polio Booster         Tetanus Nausea:       Tuberculosis test or PPD  Vitamin B6 25 mg TID   AVOID:    Dramamine      *Gardasil  Emetrol       Live Poliovirus  Ginger Root 250 mg QID    MMR (measles, mumps &  High Complex Carbs @ Bedtime    rebella)  Sea Bands-Accupressure    Varicella (Chickenpox)  Unisom 1/2 tab TID     *No known complications           If received before Pain:  Known pregnancy;   Darvocet       Resume series after  Lortab        Delivery  Percocet    Yeast:   Tramadol      Femstat  Tylenol 3      Gyne-lotrimin  Ultram       Monistat  Vicodin           MISC:         All Sunscreens           Hair Coloring/highlights          Insect Repellant's          (Including DEET)         Mystic Tans

## 2022-08-01 NOTE — Progress Notes (Signed)
New OB Intake  I connected with  Jasmine Price on 08/01/22 at  8:15 AM EDT by telephone and verified that I am speaking with the correct person using two identifiers. Nurse is located at [employer] and pt is located at home.  I explained I am completing New OB Intake today. We discussed her EDD of 02/21/2023 that is based on LMP of 05/17/2022. Pt is G4/P2012. I reviewed her allergies, medications, Medical/Surgical/OB history, and appropriate screenings. There are cats in the home. If yes Indoor; pt aware not to change litter box. Based on history, this is a/an pregnancy uncomplicated .   Patient Active Problem List   Diagnosis Date Noted   Supervision of other normal pregnancy, antepartum 08/01/2022   Abnormal uterine bleeding (AUB) 04/16/2022   Ectopic pregnancy without intrauterine pregnancy- Corunal ectopic Left      Concerns addressed today Pt have bad lower abd pain; 7/10 on pain scale; she thinks it's GI related; has drank some apple juice to see if that helps; adv may also take 2 e.s. tylenol q6h while awake and apply heat 20 minutes out of each hour while awake.  Will send msg to schedulers to see if we can get her in today and if not if she deems it necessary to go to UC or ER.  Pt very anxious about this preg because her last preg was an ectopic.  Delivery Plans:  Plans to deliver at [facility] - pt not sure; adv if she goes elsewhere she will be delivered by someone she doesn't know.  Anatomy US Explained first scheduled [place] will be 4/5th and an anatomy scan will be done at 20 weeks.  Labs Discussed  genetic screening with patient. Patient desires genetic testing to be drawn with new OB visit. Discussed possible labs to be drawn at new OB appointment.  COVID Vaccine Patient has not had COVID vaccine.   Social Determinants of Health Food Insecurity: denies food insecurity Transportation: Patient denies transportation needs. Childcare: Discussed no  children allowed at ultrasound appointments.   First visit review I reviewed new OB appt with pt. I explained she will have ob bloodwork and pap smear/pelvic exam if indicated. Explained pt will be seen by Lloyd Huger, CNM at first visit; encounter routed to appropriate provider.   Cleophas Dunker, [place] 08/01/2022  10:02 AM

## 2022-08-02 ENCOUNTER — Telehealth: Payer: Self-pay

## 2022-08-02 NOTE — Telephone Encounter (Signed)
The patient stating " she is having cramping below her belly button with constant sharp pains. The patient isn't sure if it constipation, or uti. The patient has increased fluids( water and eating  more yogurt) the pain has subsided in the last three days. She is having frequency with urine, pain urinating. Patient is had a bowel movement " quite frequently yesterday " normal not loose. The patient is busy today and would like to be reach by mychart.

## 2022-08-02 NOTE — Progress Notes (Signed)
I brought this information for Dr. Amalia Hailey review. Lovena Le will be contacting the patient and advising the patient.

## 2022-08-02 NOTE — Telephone Encounter (Signed)
I contacted the patient via phone. I lefted message letting the patient know she needs a nurse visit. The patient is scheduled for ultrasound tomorrow. I have added the patient on for nurse visit for UA.

## 2022-08-03 ENCOUNTER — Encounter: Payer: Self-pay | Admitting: Obstetrics and Gynecology

## 2022-08-03 ENCOUNTER — Ambulatory Visit (INDEPENDENT_AMBULATORY_CARE_PROVIDER_SITE_OTHER): Payer: Managed Care, Other (non HMO)

## 2022-08-03 ENCOUNTER — Ambulatory Visit (INDEPENDENT_AMBULATORY_CARE_PROVIDER_SITE_OTHER): Payer: Managed Care, Other (non HMO) | Admitting: Obstetrics and Gynecology

## 2022-08-03 ENCOUNTER — Other Ambulatory Visit: Payer: Self-pay

## 2022-08-03 DIAGNOSIS — Z369 Encounter for antenatal screening, unspecified: Secondary | ICD-10-CM

## 2022-08-03 DIAGNOSIS — Z9079 Acquired absence of other genital organ(s): Secondary | ICD-10-CM

## 2022-08-03 DIAGNOSIS — Z8759 Personal history of other complications of pregnancy, childbirth and the puerperium: Secondary | ICD-10-CM

## 2022-08-03 DIAGNOSIS — N912 Amenorrhea, unspecified: Secondary | ICD-10-CM | POA: Diagnosis not present

## 2022-08-03 DIAGNOSIS — N83201 Unspecified ovarian cyst, right side: Secondary | ICD-10-CM

## 2022-08-03 DIAGNOSIS — R102 Pelvic and perineal pain: Secondary | ICD-10-CM

## 2022-08-03 DIAGNOSIS — Z3481 Encounter for supervision of other normal pregnancy, first trimester: Secondary | ICD-10-CM

## 2022-08-03 DIAGNOSIS — Z3200 Encounter for pregnancy test, result unknown: Secondary | ICD-10-CM

## 2022-08-03 DIAGNOSIS — O0911 Supervision of pregnancy with history of ectopic or molar pregnancy, first trimester: Secondary | ICD-10-CM

## 2022-08-03 LAB — POCT URINALYSIS DIPSTICK
Bilirubin, UA: NEGATIVE
Blood, UA: NEGATIVE
Glucose, UA: NEGATIVE
Ketones, UA: NEGATIVE
Leukocytes, UA: NEGATIVE
Nitrite, UA: NEGATIVE
Protein, UA: NEGATIVE
Spec Grav, UA: 1.01 (ref 1.010–1.025)
Urobilinogen, UA: 0.2 E.U./dL
pH, UA: 5.5 (ref 5.0–8.0)

## 2022-08-03 NOTE — Patient Instructions (Signed)
Urinary Tract Infection, Adult A urinary tract infection (UTI) is an infection of any part of the urinary tract. The urinary tract includes: The kidneys. The ureters. The bladder. The urethra. These organs make, store, and get rid of pee (urine) in the body. What are the causes? This infection is caused by germs (bacteria) in your genital area. These germs grow and cause swelling (inflammation) of your urinary tract. What increases the risk? The following factors may make you more likely to develop this condition: Using a small, thin tube (catheter) to drain pee. Not being able to control when you pee or poop (incontinence). Being female. If you are female, these things can increase the risk: Using these methods to prevent pregnancy: A medicine that kills sperm (spermicide). A device that blocks sperm (diaphragm). Having low levels of a female hormone (estrogen). Being pregnant. You are more likely to develop this condition if: You have genes that add to your risk. You are sexually active. You take antibiotic medicines. You have trouble peeing because of: A prostate that is bigger than normal, if you are female. A blockage in the part of your body that drains pee from the bladder. A kidney stone. A nerve condition that affects your bladder. Not getting enough to drink. Not peeing often enough. You have other conditions, such as: Diabetes. A weak disease-fighting system (immune system). Sickle cell disease. Gout. Injury of the spine. What are the signs or symptoms? Symptoms of this condition include: Needing to pee right away. Peeing small amounts often. Pain or burning when peeing. Blood in the pee. Pee that smells bad or not like normal. Trouble peeing. Pee that is cloudy. Fluid coming from the vagina, if you are female. Pain in the belly or lower back. Other symptoms include: Vomiting. Not feeling hungry. Feeling mixed up (confused). This may be the first symptom in  older adults. Being tired and grouchy (irritable). A fever. Watery poop (diarrhea). How is this treated? Taking antibiotic medicine. Taking other medicines. Drinking enough water. In some cases, you may need to see a specialist. Follow these instructions at home:  Medicines Take over-the-counter and prescription medicines only as told by your doctor. If you were prescribed an antibiotic medicine, take it as told by your doctor. Do not stop taking it even if you start to feel better. General instructions Make sure you: Pee until your bladder is empty. Do not hold pee for a long time. Empty your bladder after sex. Wipe from front to back after peeing or pooping if you are a female. Use each tissue one time when you wipe. Drink enough fluid to keep your pee pale yellow. Keep all follow-up visits. Contact a doctor if: You do not get better after 1-2 days. Your symptoms go away and then come back. Get help right away if: You have very bad back pain. You have very bad pain in your lower belly. You have a fever. You have chills. You feeling like you will vomit or you vomit. Summary A urinary tract infection (UTI) is an infection of any part of the urinary tract. This condition is caused by germs in your genital area. There are many risk factors for a UTI. Treatment includes antibiotic medicines. Drink enough fluid to keep your pee pale yellow. This information is not intended to replace advice given to you by your health care provider. Make sure you discuss any questions you have with your health care provider. Document Revised: 11/27/2019 Document Reviewed: 11/27/2019 Elsevier Patient Education    2023 Elsevier Inc.  

## 2022-08-03 NOTE — Progress Notes (Signed)
    NURSE VISIT NOTE  Subjective:    Patient ID: Analys Knuteson, female    DOB: 1995/11/10, 27 y.o.   MRN: [id number]       HPI  Patient is a 27 y.o. [phone] female who presents for pelvic pain for several days.  Patient denies dysuria, hematuria, urinary frequency, urinary urgency, flank pain, cloudy malordorous urine, genital rash, genital irritation, and vaginal discharge.  Patient does not have a history of recurrent UTI.  Patient does not have a history of pyelonephritis.    Objective:    LMP 05/17/2022 (Approximate)    Lab Review  No results found for any visits on 08/03/22.  Assessment:   1. Pelvic pain   2. Hx of ectopic pregnancy      Plan:   Urine Culture Sent.   Cornelius Moras, CMA

## 2022-08-03 NOTE — Progress Notes (Signed)
HPI:      Jasmine Price is a 27 y.o. [phone] who LMP was Patient's last menstrual period was 05/17/2022 (approximate).  Subjective:   She presents today to be seen emergently because she had an ultrasound showing no gestational sac in the uterus.  She has a history of ectopic pregnancy and removal of left fallopian tube with her last pregnancy 1 year ago.  She reports that she had a quantitative beta-hCG done that was noted to be 70 on Tuesday a week ago.  She denies vaginal bleeding.  She does have some left-sided crampy pain. She is somewhat upset today about the possibility of another ectopic pregnancy.    Hx: The following portions of the patient's history were reviewed and updated as appropriate:             She  has a past medical history of Anxiety, Depression, HIV disease, and Pregnancy, ectopic, cornual or cervical (08/06/2021). She does not have any pertinent problems on file. She  has a past surgical history that includes Tympanostomy tube placement (Right); Xi robotic assisted salpingectomy (Left, 08/06/2021); and Wisdom tooth extraction (2021). Her family history includes ADD / ADHD in her son; Bipolar disorder in her father; COPD in her maternal grandfather; Cancer (age of onset: 13) in her maternal grandmother; Cystic fibrosis in her son; Diabetes in her maternal grandfather and maternal grandmother; Eclampsia in her mother; Gout in her father; Healthy in her brother, brother, half-brother, half-sister, half-sister, and half-sister; Hypertension in her mother; Schizophrenia in her father. She  reports that she has quit smoking. Her smoking use included cigarettes. She has never used smokeless tobacco. She reports that she does not drink alcohol and does not use drugs. She has a current medication list which includes the following prescription(s): biktarvy, bupropion, hydrocortisone, mometasone, multivitamin-prenatal, and ventolin hfa. She has No Known Allergies.       Review  of Systems:  Review of Systems  Constitutional: Denied constitutional symptoms, night sweats, recent illness, fatigue, fever, insomnia and weight loss.  Eyes: Denied eye symptoms, eye pain, photophobia, vision change and visual disturbance.  Ears/Nose/Throat/Neck: Denied ear, nose, throat or neck symptoms, hearing loss, nasal discharge, sinus congestion and sore throat.  Cardiovascular: Denied cardiovascular symptoms, arrhythmia, chest pain/pressure, edema, exercise intolerance, orthopnea and palpitations.  Respiratory: Denied pulmonary symptoms, asthma, pleuritic pain, productive sputum, cough, dyspnea and wheezing.  Gastrointestinal: Denied, gastro-esophageal reflux, melena, nausea and vomiting.  Genitourinary: Denied genitourinary symptoms including symptomatic vaginal discharge, pelvic relaxation issues, and urinary complaints.  Musculoskeletal: Denied musculoskeletal symptoms, stiffness, swelling, muscle weakness and myalgia.  Dermatologic: Denied dermatology symptoms, rash and scar.  Neurologic: Denied neurology symptoms, dizziness, headache, neck pain and syncope.  Psychiatric: Denied psychiatric symptoms, anxiety and depression.  Endocrine: Denied endocrine symptoms including hot flashes and night sweats.   Meds:   Current Outpatient Medications on File Prior to Visit  Medication Sig Dispense Refill   BIKTARVY 50-200-25 MG TABS tablet Take 1 tablet by mouth daily.     buPROPion (WELLBUTRIN) 100 MG tablet Take 300 mg by mouth every morning.     hydrocortisone (ANUSOL-HC) 2.5 % rectal cream Apply 1 Application topically as needed. (Patient not taking: Reported on 08/01/2022)     mometasone (ELOCON) 0.1 % cream Apply 1 Application topically 2 (two) times daily.     Prenatal Vit-Fe Fumarate-FA (MULTIVITAMIN-PRENATAL) 27-0.8 MG TABS tablet Take 1 tablet by mouth at bedtime.     VENTOLIN HFA 108 (90 Base) MCG/ACT inhaler Inhale 1-2 puffs into  the lungs every 6 (six) hours as needed.     No  current facility-administered medications on file prior to visit.      Objective:     There were no vitals filed for this visit. There were no vitals filed for this visit.            Ultrasound results reviewed   Empty uterus   Likely corpus luteal cyst on left   Small cystic structure on right-no ring of fire seen.  Not within the ovary          Assessment:    [id number] Patient Active Problem List   Diagnosis Date Noted   Supervision of other normal pregnancy, antepartum 08/01/2022   Abnormal uterine bleeding (AUB) 04/16/2022   Ectopic pregnancy without intrauterine pregnancy- Corunal ectopic Left       1. Possible pregnancy, not confirmed     Pregnancy of unknown location.    Patient likely having mild left-sided pain secondary corpus luteal cyst.   Plan:            1.  Quantitative beta-hCG today  2.  Repeat on Monday if levels above 70.  3.  Ultrasound Monday or Tuesday depending on quant levels Orders No orders of the defined types were placed in this encounter.   No orders of the defined types were placed in this encounter.     F/U  No follow-ups on file. I spent 23 minutes involved in the care of this patient preparing to see the patient by obtaining and reviewing her medical history (including labs, imaging tests and prior procedures), documenting clinical information in the electronic health record (EHR), counseling and coordinating care plans, writing and sending prescriptions, ordering tests or procedures and in direct communicating with the patient and medical staff discussing pertinent items from her history and physical exam.  Elonda Huskyavid J. Assyria Morreale, M.D. 08/03/2022 10:17 AM

## 2022-08-04 LAB — BETA HCG QUANT (REF LAB): hCG Quant: 375 m[IU]/mL

## 2022-08-06 ENCOUNTER — Other Ambulatory Visit: Payer: Managed Care, Other (non HMO)

## 2022-08-06 ENCOUNTER — Other Ambulatory Visit: Payer: Self-pay

## 2022-08-06 DIAGNOSIS — Z131 Encounter for screening for diabetes mellitus: Secondary | ICD-10-CM

## 2022-08-06 DIAGNOSIS — R102 Pelvic and perineal pain: Secondary | ICD-10-CM

## 2022-08-06 DIAGNOSIS — Z369 Encounter for antenatal screening, unspecified: Secondary | ICD-10-CM

## 2022-08-06 DIAGNOSIS — Z3200 Encounter for pregnancy test, result unknown: Secondary | ICD-10-CM

## 2022-08-06 DIAGNOSIS — Z8759 Personal history of other complications of pregnancy, childbirth and the puerperium: Secondary | ICD-10-CM

## 2022-08-06 DIAGNOSIS — Z3480 Encounter for supervision of other normal pregnancy, unspecified trimester: Secondary | ICD-10-CM

## 2022-08-06 LAB — BETA HCG QUANT (REF LAB): hCG Quant: 95 m[IU]/mL

## 2022-08-07 ENCOUNTER — Other Ambulatory Visit: Payer: Managed Care, Other (non HMO)

## 2022-08-07 ENCOUNTER — Other Ambulatory Visit: Payer: Self-pay

## 2022-08-07 DIAGNOSIS — Z8759 Personal history of other complications of pregnancy, childbirth and the puerperium: Secondary | ICD-10-CM

## 2022-08-07 DIAGNOSIS — O3680X0 Pregnancy with inconclusive fetal viability, not applicable or unspecified: Secondary | ICD-10-CM

## 2022-08-07 DIAGNOSIS — O3481 Maternal care for other abnormalities of pelvic organs, first trimester: Secondary | ICD-10-CM

## 2022-08-07 LAB — URINE CULTURE: Organism ID, Bacteria: NO GROWTH

## 2022-08-07 NOTE — Telephone Encounter (Signed)
Called pt, done

## 2022-08-08 ENCOUNTER — Other Ambulatory Visit: Payer: Self-pay | Admitting: Obstetrics and Gynecology

## 2022-08-08 ENCOUNTER — Other Ambulatory Visit: Payer: Managed Care, Other (non HMO)

## 2022-08-08 ENCOUNTER — Ambulatory Visit (INDEPENDENT_AMBULATORY_CARE_PROVIDER_SITE_OTHER): Payer: Managed Care, Other (non HMO)

## 2022-08-08 DIAGNOSIS — O3680X0 Pregnancy with inconclusive fetal viability, not applicable or unspecified: Secondary | ICD-10-CM

## 2022-08-08 DIAGNOSIS — N83209 Unspecified ovarian cyst, unspecified side: Secondary | ICD-10-CM

## 2022-08-08 DIAGNOSIS — Z8759 Personal history of other complications of pregnancy, childbirth and the puerperium: Secondary | ICD-10-CM

## 2022-08-08 DIAGNOSIS — Z3687 Encounter for antenatal screening for uncertain dates: Secondary | ICD-10-CM

## 2022-08-08 DIAGNOSIS — Z3200 Encounter for pregnancy test, result unknown: Secondary | ICD-10-CM

## 2022-08-09 ENCOUNTER — Other Ambulatory Visit: Payer: Self-pay

## 2022-08-09 DIAGNOSIS — O039 Complete or unspecified spontaneous abortion without complication: Secondary | ICD-10-CM

## 2022-08-09 LAB — BETA HCG QUANT (REF LAB): hCG Quant: 42 m[IU]/mL

## 2022-08-13 ENCOUNTER — Other Ambulatory Visit: Payer: Managed Care, Other (non HMO)

## 2022-08-13 DIAGNOSIS — O039 Complete or unspecified spontaneous abortion without complication: Secondary | ICD-10-CM

## 2022-08-13 LAB — BETA HCG QUANT (REF LAB)

## 2022-08-14 LAB — BETA HCG QUANT (REF LAB): hCG Quant: 5 m[IU]/mL

## 2022-08-21 ENCOUNTER — Encounter: Payer: Managed Care, Other (non HMO) | Admitting: Obstetrics

## 2022-08-23 ENCOUNTER — Telehealth: Payer: Managed Care, Other (non HMO) | Admitting: Obstetrics and Gynecology

## 2022-11-14 ENCOUNTER — Ambulatory Visit: Payer: Medicaid Other | Attending: Internal Medicine

## 2022-11-14 DIAGNOSIS — M25552 Pain in left hip: Secondary | ICD-10-CM | POA: Diagnosis present

## 2022-11-14 DIAGNOSIS — M5459 Other low back pain: Secondary | ICD-10-CM | POA: Diagnosis present

## 2022-11-14 DIAGNOSIS — M533 Sacrococcygeal disorders, not elsewhere classified: Secondary | ICD-10-CM | POA: Insufficient documentation

## 2022-11-14 NOTE — Therapy (Signed)
OUTPATIENT PHYSICAL THERAPY LOWER EXTREMITY EVALUATION   Patient Name: Jasmine Price MRN: [id number] DOB:[birth year], 27 y.o., female Today's Date: 11/19/2022  END OF SESSION:  PT End of Session - 11/19/22 0947     Visit Number 1    Number of Visits 13    Date for PT Re-Evaluation 12/31/22    PT Start Time 1245    PT Stop Time 1330    PT Time Calculation (min) 45 min    Activity Tolerance Patient tolerated treatment well    Behavior During Therapy [facility] for tasks assessed/performed             Past Medical History:  Diagnosis Date   Anxiety    Depression    HIV disease (HCC)    UNDETECTABLE   Pregnancy, ectopic, cornual or cervical 08/06/2021   Left wedge resection of cornual ectopic pregnancy   Past Surgical History:  Procedure Laterality Date   TYMPANOSTOMY TUBE PLACEMENT Right    2013 and previously as a child   WISDOM TOOTH EXTRACTION  2021   three;   XI ROBOTIC ASSISTED SALPINGECTOMY Left 08/06/2021   Procedure: XI ROBOTIC ASSISTED  Removal of Cornual Pregnancy;  Surgeon: Natale Milch, MD;  Location: [facility] ORS;  Service: Gynecology;  Laterality: Left;   Patient Active Problem List   Diagnosis Date Noted   Supervision of other normal pregnancy, antepartum 08/01/2022   Abnormal uterine bleeding (AUB) 04/16/2022   Ectopic pregnancy without intrauterine pregnancy- Corunal ectopic Left      PCP: same   REFERRING PROVIDER: Nadyne Coombes, MD  REFERRING DIAG: L hip pain  THERAPY DIAG:  Pain in joint involving pelvic region and thigh, left  Other low back pain  Sacroiliac pain  Rationale for Evaluation and Treatment: Rehabilitation  ONSET DATE: April 2024  SUBJECTIVE:   SUBJECTIVE STATEMENT: April 2024- 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  She reports she has been having low back and L hip pain prior to the fall too; but this recent episode resulted in a flare up of sx.  She reports the sx are L>R SI type pain that  refers to her lateral hip area.  She does not report tingling/numbness, more of an "achy and sore".    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    She has a 29 y/o and 29 y/o; vaginal deliveries; has at least 4 pregnancies   PAIN:  Are you having pain? Yes, 3-5/10  PRECAUTIONS: None  RED FLAGS: None Has SUI- has attended pelvic floor PT to address this in the past year  WEIGHT BEARING RESTRICTIONS: No  FALLS:  Has patient fallen in last 6 months? Yes. 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  PLOF: Independent  PATIENT GOALS: Pt reports she would like to lessen the pain and to learn how to handle the sx when she feels painful  NEXT MD VISIT: 12/05/22  OBJECTIVE:   DIAGNOSTIC FINDINGS: none for hip or back  PATIENT SURVEYS:  FOTO 59/73  COGNITION: Overall cognitive status: Within functional limits for tasks assessed     SENSATION: WFL   MUSCLE LENGTH: Normal proximal/distal hamstring length, normal quadriceps length  POSTURE: decreased lumbar lordosis  PALPATION: TTP along L>R lumbar spine paraspinal mm (+) TTP with CPA L4-5, reproduces typical sx (-) reproduction of sx with sacral PA mob  LOWER EXTREMITY  ROM:  Active ROM Right eval Left eval  Hip flexion 115 115  Hip extension 5 5  Hip abduction    Hip adduction    Hip internal rotation 15 15  Hip external rotation 35 30  Knee flexion    Knee extension    Ankle dorsiflexion    Ankle plantarflexion    Ankle inversion    Ankle eversion     (Blank rows = not tested)  LOWER EXTREMITY MMT:  MMT Right eval Left eval  Hip flexion 4 4  Hip extension 4 4  Hip abduction 4 4  Hip adduction    Hip internal rotation    Hip external rotation    Knee flexion    Knee extension    Ankle dorsiflexion    Ankle plantarflexion    Ankle inversion    Ankle eversion     (Blank rows = not tested)  LOWER EXTREMITY SPECIAL TESTS:  (-) FADDIR test R/L LE + lumbar spine AROM limitations with flexion, extension, side bend R/L + ASLR test L/R  FUNCTIONAL TESTS:  Pt able to transition from sit to stand, and stand to sit/sidelying/supine independently; demonstrates ability to perform squat  GAIT: Amb into PT clinic without AD   TODAY'S TREATMENT:                                                                                                                              DATE: 11/14/22  Initial Evaluation performed  PATIENT EDUCATION:  Education details: PT POC/goals Person educated: Patient Education method: Explanation Education comprehension: verbalized understanding  HOME EXERCISE PROGRAM: To be initiated at next session  ASSESSMENT:  CLINICAL IMPRESSION: Patient is a pleasant 27 y.o. F who was seen today for physical therapy evaluation and treatment for referral of L hip pain; her overall presentation during today's evaluation is consistent with lumbopelvic motor control/strength impairments including lumbopelvic mm dysfunction/MTPs/weakness.  Pt is very motivated to participate in PT.  She is an appropriate candidate for PT and should do well with treatment as her impairments are influencing her ability to perform her daily  activities as a dog groomer and at home.  OBJECTIVE IMPAIRMENTS: decreased activity tolerance, decreased mobility, decreased ROM, decreased strength, and impaired perceived functional ability.   ACTIVITY LIMITATIONS: carrying, lifting, bending, sitting, and squatting  PARTICIPATION LIMITATIONS: meal prep, driving, community activity, and occupation  PERSONAL FACTORS: Past/current experiences and Time since onset of injury/illness/exacerbation are also affecting patient's functional outcome.   REHAB POTENTIAL: Excellent  CLINICAL DECISION MAKING: Stable/uncomplicated  EVALUATION COMPLEXITY: Low   GOALS: Goals reviewed with patient? Yes  SHORT TERM GOALS: Target date: 11/29/22 Pt will demonstrate ability to perform squat and sit with optimal lumbopelvic body mechanics Baseline: posterior pelvic tilt/reduced lumbar lordosis, spine dominant squat technique Goal status: INITIAL     LONG TERM GOALS: Target date: 12/31/22  Improve FOTO to >72 indicating improved ability to perform her daily activities  without being limited by lumbopelvic pain Baseline: 53 Goal status: INITIAL  2.  Pt will be able to ride in car x 1 hour with <2/10  back/hip pain to drive to client's homes as a mobile dog groomer Baseline:  Goal status: INITIAL  3.  Pt will be able to lift >30 lbs to be able to carry her grooming table in/out of client's homes independently for her job Baseline: having difficulty, spouse assists Goal status: INITIAL   PLAN:  PT FREQUENCY: 2x/week  PT DURATION: 6 weeks  PLANNED INTERVENTIONS: Therapeutic exercises, Therapeutic activity, Neuromuscular re-education, Balance training, Gait training, Patient/Family education, Self Care, and Joint mobilization  PLAN FOR NEXT SESSION: motor control retraining lumbopelvic region, manual therapy for MTPs, body mechanics training for sitting, standing, squatting, car rides  Max Fickle, PT, DPT, OCS  #[id number]  Ardine Bjork,  PT 11/19/2022, 10:10 AM

## 2022-11-19 ENCOUNTER — Ambulatory Visit: Payer: Medicaid Other

## 2022-11-19 DIAGNOSIS — M25552 Pain in left hip: Secondary | ICD-10-CM

## 2022-11-19 DIAGNOSIS — M5459 Other low back pain: Secondary | ICD-10-CM

## 2022-11-19 DIAGNOSIS — M533 Sacrococcygeal disorders, not elsewhere classified: Secondary | ICD-10-CM

## 2022-11-19 NOTE — Therapy (Signed)
OUTPATIENT PHYSICAL THERAPY LOWER EXTREMITY/LUMBAR TREATMENT   Patient Name: Jasmine Price MRN: [id number] DOB:[birth year], 27 y.o., female Today's Date: 11/19/2022  END OF SESSION:  PT End of Session - 11/19/22 1239     Visit Number 2    Number of Visits 9    Date for PT Re-Evaluation 12/31/22    Authorization Type 7/22-9/19 for 8 PT visits  [facility] 2024    PT Start Time 1245    PT Stop Time 1330    PT Time Calculation (min) 45 min    Activity Tolerance Patient tolerated treatment well    Behavior During Therapy [facility] for tasks assessed/performed             Past Medical History:  Diagnosis Date   Anxiety    Depression    HIV disease (HCC)    UNDETECTABLE   Pregnancy, ectopic, cornual or cervical 08/06/2021   Left wedge resection of cornual ectopic pregnancy   Past Surgical History:  Procedure Laterality Date   TYMPANOSTOMY TUBE PLACEMENT Right    2013 and previously as a child   WISDOM TOOTH EXTRACTION  2021   three;   XI ROBOTIC ASSISTED SALPINGECTOMY Left 08/06/2021   Procedure: XI ROBOTIC ASSISTED  Removal of Cornual Pregnancy;  Surgeon: Natale Milch, MD;  Location: [facility] ORS;  Service: Gynecology;  Laterality: Left;   Patient Active Problem List   Diagnosis Date Noted   Supervision of other normal pregnancy, antepartum 08/01/2022   Abnormal uterine bleeding (AUB) 04/16/2022   Ectopic pregnancy without intrauterine pregnancy- Corunal ectopic Left      PCP: same   REFERRING PROVIDER: Nadyne Coombes, MD  REFERRING DIAG: L hip pain  THERAPY DIAG:  Pain in joint involving pelvic region and thigh, left  Other low back pain  Sacroiliac pain  Rationale for Evaluation and Treatment: Rehabilitation  ONSET DATE: April 2024  SUBJECTIVE: (From initial evaluation note 11/14/22)  SUBJECTIVE STATEMENT: April 2024- 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  She reports she has been having low back and L hip pain  prior to the fall too; but this recent episode resulted in a flare up of sx.  She reports the sx are L>R SI type pain that refers to her lateral hip area.  She does not report tingling/numbness, more of an "achy and sore".    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    She has a 29 y/o and 29 y/o; vaginal deliveries; has at least 4 pregnancies   PAIN:  Are you having pain? Yes, 3-5/10  PRECAUTIONS: None  RED FLAGS: None Has SUI- has attended pelvic floor PT to address this in the past year  WEIGHT BEARING RESTRICTIONS: No  FALLS:  Has patient fallen in last 6 months? Yes. 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   PLOF: Independent  PATIENT GOALS: Pt reports she would like to lessen the pain and to learn how to handle the sx when she feels painful  NEXT MD VISIT: 12/05/22  OBJECTIVE:   DIAGNOSTIC FINDINGS: none for hip or back  PATIENT SURVEYS:  FOTO 59/73  COGNITION: Overall cognitive status: Within functional limits for tasks assessed     SENSATION: WFL   MUSCLE LENGTH: Normal proximal/distal hamstring length, normal quadriceps length  POSTURE: decreased lumbar lordosis  PALPATION: TTP along L>R lumbar spine  paraspinal mm (+) TTP with CPA L4-5, reproduces typical sx (-) reproduction of sx with sacral PA mob  LOWER EXTREMITY ROM:  Active ROM Right eval Left eval  Hip flexion 115 115  Hip extension 5 5  Hip abduction    Hip adduction    Hip internal rotation 15 15  Hip external rotation 35 30  Knee flexion    Knee extension    Ankle dorsiflexion    Ankle plantarflexion    Ankle inversion    Ankle eversion     (Blank rows = not tested)  LOWER EXTREMITY MMT:  MMT Right eval Left eval  Hip flexion 4 4  Hip extension 4 4  Hip abduction 4 4  Hip adduction    Hip internal rotation    Hip external rotation    Knee flexion    Knee extension    Ankle dorsiflexion    Ankle plantarflexion    Ankle inversion    Ankle eversion     (Blank rows = not tested)  LOWER EXTREMITY SPECIAL TESTS:  (-) FADDIR test R/L LE + lumbar spine AROM limitations with flexion, extension, side bend R/L + ASLR test L/R  FUNCTIONAL TESTS:  Pt able to transition from sit to stand, and stand to sit/sidelying/supine independently; demonstrates ability to perform squat  GAIT: Amb into PT clinic without AD   TODAY'S TREATMENT:                                                                                                                              DATE: 11/19/22  Subjective: Pt reports she is overall feeling a little better than last session when she was here.  She has not been in the car for long durations this week.  Pain: 0/10 upon arrival  Objective:  Therapeutic Exercise:  (+) reproduction of L low back/posterior gluteal sx with lumbar flexion AROM and R side bend AROM in standing No worse with repeated flexion  Supine SKTC x5 ea- no sx Supine hooklying trunk rotation x10 ea- no sx Prone on elbows x 2 min- no sx Sidelying open book thoracolumbar rotation 10x ea- no sx, pt lacks thoracic rotation AROM b/l   Manual therapy: (+) MTPs L>R thoracolumbar paraspinal mm STM L>R thoracolumbar  paraspinals and L glute max (proximal) hypervolt (-) thigh  thrust R and L reproduction of sx today  Pt reports at end of session would like to work on Estate manager/land agent for standing as a groomer (pt goal) during PT  HEP initiation/instruction: see below, discussed plan to focus on thoracolumbar mobility for HEP    PATIENT EDUCATION:  Education details: PT POC/goals Person educated: Patient Education method: Explanation Education comprehension: verbalized understanding  HOME EXERCISE PROGRAM: Access Code: [email or web address] URL: [email or web address] Date: 11/19/2022 Prepared by: Max Fickle  Exercises - Supine Lower Trunk Rotation  - 2 x daily - 7 x weekly - 10 reps - Sidelying Thoracic Rotation with Open Book  - 2 x daily - 7 x weekly - 10 reps - Static Prone on Elbows  - 2 x daily - 7 x weekly - 1 reps - 2 min hold  ASSESSMENT:  CLINICAL IMPRESSION: Patient did not demonstrate a clear directional preference for movement with repeated motion testing today.  Focused on initiating therapeutic exercises for thoracolumbar rotation and extension AROM deficits and manual therapy for muscle dysfunction today.  Plan to progress with motor control retraining and body mechanics training at next session.  Pt is very motivated to participate in PT.  She is an appropriate candidate for PT and should do well with treatment as her impairments are influencing her ability to perform her daily activities as a dog groomer and at home.  OBJECTIVE IMPAIRMENTS: decreased activity tolerance, decreased mobility, decreased ROM, decreased strength, and impaired perceived functional ability.   ACTIVITY LIMITATIONS: carrying, lifting, bending, sitting, and squatting  PARTICIPATION LIMITATIONS: meal prep, driving, community activity, and occupation  PERSONAL FACTORS: Past/current experiences and Time since onset of injury/illness/exacerbation are also affecting patient's functional outcome.   REHAB  POTENTIAL: Excellent  CLINICAL DECISION MAKING: Stable/uncomplicated  EVALUATION COMPLEXITY: Low   GOALS: Goals reviewed with patient? Yes  SHORT TERM GOALS: Target date: 11/29/22 Pt will demonstrate ability to perform squat and sit with optimal lumbopelvic body mechanics Baseline: posterior pelvic tilt/reduced lumbar lordosis, spine dominant squat technique Goal status: INITIAL     LONG TERM GOALS: Target date: 12/31/22  Improve FOTO to >72 indicating improved ability to perform her daily activities  without being limited by lumbopelvic pain Baseline: 53 Goal status: INITIAL  2.  Pt will be able to ride in car x 1 hour with <2/10  back/hip pain to drive to client's homes as a mobile dog groomer Baseline:  Goal status: INITIAL  3.  Pt will be able to lift >30 lbs to be able to carry her grooming table in/out of client's homes independently for her job Baseline: having difficulty, spouse assists Goal status: INITIAL   PLAN:  PT FREQUENCY: 2x/week  PT DURATION: 6 weeks  PLANNED INTERVENTIONS: Therapeutic exercises, Therapeutic activity, Neuromuscular re-education, Balance training, Gait training, Patient/Family education, Self Care, and Joint mobilization  PLAN FOR NEXT SESSION: motor control retraining lumbopelvic region, manual therapy for MTPs, body mechanics training for sitting, standing, squatting, car rides  Max Fickle, PT, DPT, OCS  #[id number]  Ardine Bjork, PT 11/19/2022, 2:25 PM

## 2022-11-27 ENCOUNTER — Ambulatory Visit: Payer: Medicaid Other

## 2022-11-27 DIAGNOSIS — M25552 Pain in left hip: Secondary | ICD-10-CM | POA: Diagnosis not present

## 2022-11-27 DIAGNOSIS — M533 Sacrococcygeal disorders, not elsewhere classified: Secondary | ICD-10-CM

## 2022-11-27 DIAGNOSIS — M5459 Other low back pain: Secondary | ICD-10-CM

## 2022-11-27 NOTE — Therapy (Addendum)
OUTPATIENT PHYSICAL THERAPY LOWER EXTREMITY/LUMBAR TREATMENT   Patient Name: Jasmine Price MRN: [id number] DOB:26-Mar-1996, 27 y.o., female Today's Date: 11/27/2022  END OF SESSION:  PT End of Session - 11/27/22 1249     Visit Number 3    Number of Visits 9    Date for PT Re-Evaluation 12/31/22    Authorization Type 7/22-9/19 for 8 PT visits  [facility] 2024    PT Start Time 1248    PT Stop Time 1330    PT Time Calculation (min) 42 min    Activity Tolerance Patient tolerated treatment well    Behavior During Therapy [facility] for tasks assessed/performed             Past Medical History:  Diagnosis Date   Anxiety    Depression    HIV disease (HCC)    UNDETECTABLE   Pregnancy, ectopic, cornual or cervical 08/06/2021   Left wedge resection of cornual ectopic pregnancy   Past Surgical History:  Procedure Laterality Date   TYMPANOSTOMY TUBE PLACEMENT Right    2013 and previously as a child   WISDOM TOOTH EXTRACTION  2021   three;   XI ROBOTIC ASSISTED SALPINGECTOMY Left 08/06/2021   Procedure: XI ROBOTIC ASSISTED  Removal of Cornual Pregnancy;  Surgeon: Natale Milch, MD;  Location: [facility] ORS;  Service: Gynecology;  Laterality: Left;   Patient Active Problem List   Diagnosis Date Noted   Supervision of other normal pregnancy, antepartum 08/01/2022   Abnormal uterine bleeding (AUB) 04/16/2022   Ectopic pregnancy without intrauterine pregnancy- Corunal ectopic Left      PCP: same   REFERRING PROVIDER: Nadyne Coombes, MD  REFERRING DIAG: L hip pain  THERAPY DIAG:  Pain in joint involving pelvic region and thigh, left  Other low back pain  Sacroiliac pain  Rationale for Evaluation and Treatment: Rehabilitation  ONSET DATE: April 2024  SUBJECTIVE: (From initial evaluation note 11/14/22)  SUBJECTIVE STATEMENT: April 2024- 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  She reports she has been having low back and L hip pain  prior to the fall too; but this recent episode resulted in a flare up of sx.  She reports the sx are L>R SI type pain that refers to her lateral hip area.  She does not report tingling/numbness, more of an "achy and sore".    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    She has a 29 y/o and 29 y/o; vaginal deliveries; has at least 4 pregnancies   PAIN:  Are you having pain? Yes, 3-5/10  PRECAUTIONS: None  RED FLAGS: None Has SUI- has attended pelvic floor PT to address this in the past year  WEIGHT BEARING RESTRICTIONS: No  FALLS:  Has patient fallen in last 6 months? Yes. 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   PLOF: Independent  PATIENT GOALS: Pt reports she would like to lessen the pain and to learn how to handle the sx when she feels painful  NEXT MD VISIT: 12/05/22  OBJECTIVE:   DIAGNOSTIC FINDINGS: none for hip or back  PATIENT SURVEYS:  FOTO 59/73  COGNITION: Overall cognitive status: Within functional limits for tasks assessed     SENSATION: WFL   MUSCLE LENGTH: Normal proximal/distal hamstring length, normal quadriceps length  POSTURE: decreased lumbar lordosis  PALPATION: TTP along L>R lumbar spine  paraspinal mm (+) TTP with CPA L4-5, reproduces typical sx (-) reproduction of sx with sacral PA mob  LOWER EXTREMITY ROM:  Active ROM Right eval Left eval  Hip flexion 115 115  Hip extension 5 5  Hip abduction    Hip adduction    Hip internal rotation 15 15  Hip external rotation 35 30  Knee flexion    Knee extension    Ankle dorsiflexion    Ankle plantarflexion    Ankle inversion    Ankle eversion     (Blank rows = not tested)  LOWER EXTREMITY MMT:  MMT Right eval Left eval  Hip flexion 4 4  Hip extension 4 4  Hip abduction 4 4  Hip adduction    Hip internal rotation    Hip external rotation    Knee flexion    Knee extension    Ankle dorsiflexion    Ankle plantarflexion    Ankle inversion    Ankle eversion     (Blank rows = not tested)  LOWER EXTREMITY SPECIAL TESTS:  (-) FADDIR test R/L LE + lumbar spine AROM limitations with flexion, extension, side bend R/L + ASLR test L/R  FUNCTIONAL TESTS:  Pt able to transition from sit to stand, and stand to sit/sidelying/supine independently; demonstrates ability to perform squat  GAIT: Amb into PT clinic without AD   TODAY'S TREATMENT:                                                                                                                              DATE: 11/27/22  Subjective: Pt reports overall her back is feeling "ok" this morning upon arrival.  No new complaints.   Pain: 0/10 upon arrival  Objective:  Therapeutic Exercise:  Supine SKTC x5 ea Supine hooklying trunk rotation x10 ea Prone on elbows x 2 min Sidelying open book thoracolumbar rotation 10x ea  Supine hooklying bridge: 2x12, focused on breathing during movement Supine hooklying Diaphragmatic breathing + TA activation x10  Body mechanics training/practice: pt demonstrated her technique for lifting something off floor (used yellow physioball)- she demonstrates breath holding, keeps knees straight and bends forward and trunk.  Pt  education for using hips/knees and trunk to perform functional movement with most efficient movement pattern; practice again multiple attempts with verbal/tactile cues with use of  hip/knee flexion.  Then practiced with lifting 8 lb med ball multiple attempts.   Manual therapy: (+) MTPs L>R thoracolumbar paraspinal mm STM L>R thoracolumbar paraspinals and L glute max   Pt would like to work on Estate manager/land agent for standing as a groomer (pt goal)    PATIENT EDUCATION:  Education details: PT POC/goals/HEP- see below Person educated: Patient Education method: Explanation Education comprehension: verbalized understanding  HOME EXERCISE PROGRAM: Access Code: [email or web address] URL: [email or web address] Date: 11/27/2022 Prepared by: Max Fickle  Exercises - Supine Lower Trunk Rotation  - 2 x daily - 7 x weekly - 10 reps - Sidelying Thoracic Rotation with Open Book  - 2 x daily - 7 x weekly - 10 reps - Static Prone on Elbows  - 2 x daily - 7 x weekly - 1 reps - 2 min hold - Supine Transversus Abdominis Bracing - Hands on Stomach  - 2 x daily - 7 x weekly - 2 sets - 10 reps - Supine Bridge  - 2 x daily - 7 x weekly - 2 sets - 10 reps  ASSESSMENT:  CLINICAL IMPRESSION: Able to initiate motor control retraining/strengthening for lumbopelvic region today without c/o increaesd LBP.  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  Pt would benefit from more practice with squat/lifting from floor body mechanics at next session- she tends to hold breath and does not demonstrate fluid coordination of hip/knee flexion with lifting movement yet. Pt is very motivated to participate in PT.  She is an appropriate candidate for PT and should do well with treatment as her impairments are influencing her ability to perform her daily activities as a dog groomer and at home.  OBJECTIVE IMPAIRMENTS: decreased activity tolerance, decreased mobility,  decreased ROM, decreased strength, and impaired perceived functional ability.   ACTIVITY LIMITATIONS: carrying, lifting, bending, sitting, and squatting  PARTICIPATION LIMITATIONS: meal prep, driving, community activity, and occupation  PERSONAL FACTORS: Past/current experiences and Time since onset of injury/illness/exacerbation are also affecting patient's functional outcome.   REHAB POTENTIAL: Excellent  CLINICAL DECISION MAKING: Stable/uncomplicated  EVALUATION COMPLEXITY: Low   GOALS: Goals reviewed with patient? Yes  SHORT TERM GOALS: Target date: 11/29/22 Pt will demonstrate ability to perform squat and sit with optimal lumbopelvic body mechanics Baseline: posterior pelvic tilt/reduced lumbar lordosis, spine dominant squat technique Goal status: INITIAL   LONG TERM GOALS: Target date: 12/31/22  Improve FOTO to >72 indicating improved ability to perform her daily activities  without being limited by lumbopelvic pain Baseline: 53 Goal status: INITIAL  2.  Pt will be able to ride in car x 1 hour with <2/10  back/hip pain to drive to client's homes as a mobile dog groomer Baseline:  Goal status: INITIAL  3.  Pt will be able to lift >30 lbs to be able to carry her grooming table in/out of client's homes independently for her job Baseline: having difficulty, spouse assists Goal status: INITIAL   PLAN:  PT FREQUENCY: 2x/week  PT DURATION: 6 weeks  PLANNED INTERVENTIONS: Therapeutic exercises, Therapeutic activity, Neuromuscular re-education, Balance training, Gait training, Patient/Family education, Self Care, and Joint mobilization  PLAN FOR NEXT SESSION: motor control retraining lumbopelvic region, manual therapy for MTPs, body mechanics training for sitting, standing, squatting, car rides  Max Fickle, PT, DPT, OCS  #[id number]  Ardine Bjork, PT 11/27/2022, 2:15 PM

## 2022-11-29 ENCOUNTER — Ambulatory Visit: Payer: Medicaid Other

## 2022-12-03 ENCOUNTER — Ambulatory Visit: Payer: Medicaid Other | Attending: Internal Medicine

## 2022-12-03 DIAGNOSIS — M5459 Other low back pain: Secondary | ICD-10-CM | POA: Insufficient documentation

## 2022-12-03 DIAGNOSIS — R278 Other lack of coordination: Secondary | ICD-10-CM | POA: Diagnosis present

## 2022-12-03 DIAGNOSIS — M533 Sacrococcygeal disorders, not elsewhere classified: Secondary | ICD-10-CM | POA: Insufficient documentation

## 2022-12-03 DIAGNOSIS — M25552 Pain in left hip: Secondary | ICD-10-CM | POA: Diagnosis present

## 2022-12-03 DIAGNOSIS — M6281 Muscle weakness (generalized): Secondary | ICD-10-CM | POA: Diagnosis present

## 2022-12-03 NOTE — Therapy (Signed)
OUTPATIENT PHYSICAL THERAPY LOWER EXTREMITY/LUMBAR TREATMENT   Patient Name: Jasmine Price MRN: [id number] DOB:[birth year], 27 y.o., female Today's Date: 12/03/2022  END OF SESSION:  PT End of Session - 12/03/22 1246     Visit Number 4    Number of Visits 9    Date for PT Re-Evaluation 12/31/22    Authorization Type 7/22-9/19 for 8 PT visits  Healthy [facility] 2024    PT Start Time 1242    PT Stop Time 1322    PT Time Calculation (min) 40 min    Activity Tolerance Patient tolerated treatment well;No increased pain    Behavior During Therapy [facility] for tasks assessed/performed             Past Medical History:  Diagnosis Date   Anxiety    Depression    HIV disease (HCC)    UNDETECTABLE   Pregnancy, ectopic, cornual or cervical 08/06/2021   Left wedge resection of cornual ectopic pregnancy   Past Surgical History:  Procedure Laterality Date   TYMPANOSTOMY TUBE PLACEMENT Right    2013 and previously as a child   WISDOM TOOTH EXTRACTION  2021   three;   XI ROBOTIC ASSISTED SALPINGECTOMY Left 08/06/2021   Procedure: XI ROBOTIC ASSISTED  Removal of Cornual Pregnancy;  Surgeon: Natale Milch, MD;  Location: [facility] ORS;  Service: Gynecology;  Laterality: Left;   Patient Active Problem List   Diagnosis Date Noted   Supervision of other normal pregnancy, antepartum 08/01/2022   Abnormal uterine bleeding (AUB) 04/16/2022   Ectopic pregnancy without intrauterine pregnancy- Corunal ectopic Left      PCP: same   REFERRING PROVIDER: Nadyne Coombes, MD  REFERRING DIAG: L hip pain  THERAPY DIAG:  Pain in joint involving pelvic region and thigh, left  Other low back pain  Sacroiliac pain  Other lack of coordination  Muscle weakness (generalized)  Rationale for Evaluation and Treatment: Rehabilitation  ONSET DATE: April 2024  SUBJECTIVE:   SUBJECTIVE STATEMENT: HEP going well. Pain is 4/10, nothing specifically exacerbated today.  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  She reports she has been having low back and L hip pain prior to the fall too; but this recent episode resulted in a flare up of sx.  She reports the sx are L>R SI type pain that refers to her lateral hip area.  She does not report tingling/numbness, more of an "achy and sore".  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 Wants to get to the gym to work on getting back to working out again. Aggravating factors: lifting/carrying, bending over to do portable grooming, prolonged car rides >30 min (has to drive a lot for work and for driving husband to work); sometimes cooking Alleviating factors: changing positions, stretches; hasn't tried ice/heatAlso has had 2 pregnancy losses; got diagnosed with PCOS and has just started medicine for this. She has a 29 y/o and 29 y/o; vaginal deliveries; has at least 4 pregnancies. PAIN:  Are you having pain? 4/10 today   PRECAUTIONS: None  RED FLAGS: None Has SUI- has attended pelvic floor PT to address this in the past year  WEIGHT BEARING RESTRICTIONS: No  FALLS:  Has patient fallen in last 6 months? Yes. 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  PLOF: Independent  PATIENT GOALS: Pt reports she would like to lessen the pain and to learn how to handle the sx when she feels painful  NEXT MD VISIT: 12/05/22  OBJECTIVE:   LOWER EXTREMITY ROM:  ROM Right eval Left eval  Hip flexion 131 135         TODAY'S TREATMENT:                                                                                                                              DATE: 12/03/22   Therapeutic Exercise:  Hooklying SKTC 3x30sec bilat, gentle FABER stretch 3x30sec bilat Hooklying lowertrunk rotations x knee spacer 10x10sec,  alternating  MFR to Left thoracic paraspinal spasms x9 minutes   Hooklying bridge 1x12 Hooklying wand flexion x20   Hooklying bridge 1x12 Medicine ball overhead hold + alternate marching 1x20 (hooklying)   Medicine ball puppy lift from step stool x12 (cues for bent knee)  20lb cable upward chop diagonal x10 bilat   PATIENT EDUCATION:  Education details: roll of reactive spasm when joints are acutely frazzled   HOME EXERCISE PROGRAM: Access Code: [email or web address] URL: [email or web address] Date: 11/27/2022 Prepared by: Max Fickle  Exercises - Supine Lower Trunk Rotation  - 2 x daily - 7 x weekly - 10 reps - Sidelying Thoracic Rotation with Open Book  - 2 x daily - 7 x weekly - 10 reps - Static Prone on Elbows  - 2 x daily - 7 x weekly - 1 reps - 2 min hold - Supine Transversus Abdominis Bracing - Hands on Stomach  - 2 x daily - 7 x weekly - 2 sets - 10 reps - Supine Bridge  - 2 x daily - 7 x weekly - 2 sets - 10 reps  ASSESSMENT:  CLINICAL IMPRESSION: Good tolerance to session in general. Continued with gentle stretching and strengthening of core. She is an appropriate candidate for PT and should do well with treatment as her impairments are influencing her ability to perform her daily activities as a dog groomer and at home.  OBJECTIVE IMPAIRMENTS: decreased activity tolerance, decreased mobility, decreased ROM, decreased strength, and impaired perceived functional ability.   ACTIVITY LIMITATIONS: carrying, lifting, bending, sitting, and squatting  PARTICIPATION LIMITATIONS: meal prep, driving, community activity, and occupation  PERSONAL FACTORS: Past/current experiences and Time since onset of injury/illness/exacerbation are also affecting patient's functional outcome.   REHAB POTENTIAL: Excellent  CLINICAL DECISION MAKING: Stable/uncomplicated  EVALUATION COMPLEXITY: Low   GOALS: Goals reviewed with patient? Yes  SHORT TERM GOALS: Target date:  11/29/22 Pt will demonstrate ability to perform squat and sit with optimal lumbopelvic body mechanics Baseline: posterior pelvic tilt/reduced lumbar lordosis, spine dominant squat technique Goal status: INITIAL   LONG TERM GOALS: Target date: 12/31/22  Improve FOTO to >72 indicating improved ability to perform her daily activities  without being limited by lumbopelvic pain Baseline: 53 Goal status: INITIAL  2.  Pt will be able to ride  in car x 1 hour with <2/10  back/hip pain to drive to client's homes as a mobile dog groomer Baseline:  Goal status: INITIAL  3.  Pt will be able to lift >30 lbs to be able to carry her grooming table in/out of client's homes independently for her job Baseline: having difficulty, spouse assists Goal status: INITIAL   PLAN:  PT FREQUENCY: 2x/week  PT DURATION: 6 weeks  PLANNED INTERVENTIONS: Therapeutic exercises, Therapeutic activity, Neuromuscular re-education, Balance training, Gait training, Patient/Family education, Self Care, and Joint mobilization  PLAN FOR NEXT SESSION: motor control retraining lumbopelvic region, manual therapy for MTPs, body mechanics training for sitting, standing, squatting, car rides   Murray Guzzetta C, PT 12/03/2022, 12:50 PM   12:50 PM, 12/03/22 Rosamaria Lints, PT, DPT Physical Therapist - [facility] in [place]  [phone] (Office)

## 2022-12-10 ENCOUNTER — Ambulatory Visit: Payer: Medicaid Other

## 2022-12-12 ENCOUNTER — Ambulatory Visit: Payer: Medicaid Other

## 2022-12-12 DIAGNOSIS — M25552 Pain in left hip: Secondary | ICD-10-CM | POA: Diagnosis not present

## 2022-12-12 DIAGNOSIS — M5459 Other low back pain: Secondary | ICD-10-CM

## 2022-12-12 DIAGNOSIS — M533 Sacrococcygeal disorders, not elsewhere classified: Secondary | ICD-10-CM

## 2022-12-12 NOTE — Therapy (Signed)
OUTPATIENT PHYSICAL THERAPY LOWER EXTREMITY/LUMBAR TREATMENT   Patient Name: Jasmine Price MRN: [id number] DOB:[birth year], 27 y.o., female Today's Date: 12/12/2022  END OF SESSION:  PT End of Session - 12/12/22 1302     Visit Number 5    Number of Visits 9    Date for PT Re-Evaluation 12/31/22    Authorization Type 7/22-9/19 for 8 PT visits  Healthy [facility] 2024    PT Start Time 1250    PT Stop Time 1335    PT Time Calculation (min) 45 min    Activity Tolerance Patient tolerated treatment well;No increased pain    Behavior During Therapy [facility] for tasks assessed/performed             Past Medical History:  Diagnosis Date   Anxiety    Depression    HIV disease (HCC)    UNDETECTABLE   Pregnancy, ectopic, cornual or cervical 08/06/2021   Left wedge resection of cornual ectopic pregnancy   Past Surgical History:  Procedure Laterality Date   TYMPANOSTOMY TUBE PLACEMENT Right    2013 and previously as a child   WISDOM TOOTH EXTRACTION  2021   three;   XI ROBOTIC ASSISTED SALPINGECTOMY Left 08/06/2021   Procedure: XI ROBOTIC ASSISTED  Removal of Cornual Pregnancy;  Surgeon: Natale Milch, MD;  Location: [facility] ORS;  Service: Gynecology;  Laterality: Left;   Patient Active Problem List   Diagnosis Date Noted   Supervision of other normal pregnancy, antepartum 08/01/2022   Abnormal uterine bleeding (AUB) 04/16/2022   Ectopic pregnancy without intrauterine pregnancy- Corunal ectopic Left      PCP: same   REFERRING PROVIDER: Nadyne Coombes, MD  REFERRING DIAG: L hip pain  THERAPY DIAG:  Pain in joint involving pelvic region and thigh, left  Other low back pain  Sacroiliac pain  Rationale for Evaluation and Treatment: Rehabilitation  ONSET DATE: April 2024  SUBJECTIVE: (From initial evaluation note 11/14/22)  SUBJECTIVE STATEMENT: April 2024- 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  She reports she has been having low back  and L hip pain prior to the fall too; but this recent episode resulted in a flare up of sx.  She reports the sx are L>R SI type pain that refers to her lateral hip area.  She does not report tingling/numbness, more of an "achy and sore".    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    She has a 29 y/o and 29 y/o; vaginal deliveries; has at least 4 pregnancies   PAIN:  Are you having pain? Yes, 3-5/10  PRECAUTIONS: None  RED FLAGS: None Has SUI- has attended pelvic floor PT to address this in the past year  WEIGHT BEARING RESTRICTIONS: No  FALLS:  Has patient fallen in last 6 months? Yes. 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   PLOF: Independent  PATIENT GOALS: Pt reports she would like to lessen the pain and to learn how to handle the sx when she feels painful  NEXT MD VISIT: 12/05/22  OBJECTIVE:   DIAGNOSTIC FINDINGS: none for hip or back  PATIENT SURVEYS:  FOTO 59/73  COGNITION: Overall cognitive status: Within functional limits for tasks assessed     SENSATION: WFL   MUSCLE LENGTH: Normal proximal/distal hamstring length, normal quadriceps length  POSTURE: decreased lumbar lordosis  PALPATION: TTP along  L>R lumbar spine paraspinal mm (+) TTP with CPA L4-5, reproduces typical sx (-) reproduction of sx with sacral PA mob  LOWER EXTREMITY ROM:  Active ROM Right eval Left eval  Hip flexion 115 115  Hip extension 5 5  Hip abduction    Hip adduction    Hip internal rotation 15 15  Hip external rotation 35 30  Knee flexion    Knee extension    Ankle dorsiflexion    Ankle plantarflexion    Ankle inversion    Ankle eversion     (Blank rows = not tested)  LOWER EXTREMITY MMT:  MMT Right eval Left eval  Hip flexion 4 4  Hip extension 4 4  Hip abduction 4 4  Hip adduction    Hip internal rotation    Hip external rotation    Knee flexion    Knee extension    Ankle dorsiflexion    Ankle plantarflexion    Ankle inversion    Ankle eversion     (Blank rows = not tested)  LOWER EXTREMITY SPECIAL TESTS:  (-) FADDIR test R/L LE + lumbar spine AROM limitations with flexion, extension, side bend R/L + ASLR test L/R  FUNCTIONAL TESTS:  Pt able to transition from sit to stand, and stand to sit/sidelying/supine independently; demonstrates ability to perform squat  GAIT: Amb into PT clinic without AD   TODAY'S TREATMENT:                                                                                                                              DATE: 12/12/22  Subjective: Pt reports last PT session felt like a good workout.  She continues to note having back pain with prolonged grooming sessions; sometimes she kneels to finish the dog grooming because it is more comfortable.  She got a bike and is thinking about riding.  Mid back spasms still happening.  Pain: 0/10 upon arrival  Objective:  Therapeutic Exercise:   Hooklying SKTC 3x30sec bilat, gentle FABER stretch 3x30sec bilat Hooklying lowertrunk rotations x knee spacer 10x10sec, alternating Hip FABER stretch: 30 sec ea  Supine alternating shoulder extension (for scapulothoracic mm mobility) x10 ea Supine shoulder/scapular  horizontal abd/add x10 ea (for scapulothoracic mobility) Cat/cow/child's pose x 10 for thoracic/lumbar mm mobility/MTPs  Supine hooklying bridge: 2x12, focused on breathing  during movement Supine hooklying TA activation + alt LE march 2x10 Hooklying exhale+ palm press into white physioball x20  9# "puppy lift"  from step stool 2x12  Standing countertop "L" stretch x5- discussed how to use this and scapular retractions and standing with foot propped on step stool as "movement breaks" during grooming sessions to change positions from being in constant static slightly flexed position at spine for long periods of time. Sx management technique/body mechanics training and education.  Not today: Medicine ball overhead hold + alternate marching 1x20 (hooklying)  20lb cable upward chop diagonal x10 bilat  Prone on elbows x 2 min Sidelying open book thoracolumbar rotation 10x ea   PATIENT EDUCATION:  Education details: PT POC/goals/HEP/body mechanics/exercise technique Person educated: Patient Education method: Explanation Education comprehension: verbalized understanding  HOME EXERCISE PROGRAM: Access Code: [email or web address] URL: [email or web address] Date: 12/12/2022 Prepared by: Max Fickle  Exercises - Supine Lower Trunk Rotation  - 2 x daily - 7 x weekly - 10 reps - Sidelying Thoracic Rotation with Open Book  - 2 x daily - 7 x weekly - 10 reps - Static Prone on Elbows  - 2 x daily - 7 x weekly - 1 reps - 2 min hold - Supine Transversus Abdominis Bracing - Hands on Stomach  - 2 x daily - 7 x weekly - 2 sets - 10 reps - Supine Bridge  - 2 x daily - 7 x weekly - 2 sets - 10 reps - Cat Cow to Child's Pose  - 1 x daily - 7 x weekly - 10 reps - Standing 'L' Stretch at Counter  - 1 x daily - 7 x weekly - 10 reps  ASSESSMENT:  CLINICAL IMPRESSION: Pt overall tolerated gentle stretching and core mm retraining well today.  Incorporated more body mechanics education for position  changes for sx management during her work day into session today.  Progressed her HEP to incorporate these new movements/exercises.  She is an appropriate candidate for PT and should do well with treatment as her impairments are influencing her ability to perform her daily activities as a dog groomer and at home.  OBJECTIVE IMPAIRMENTS: decreased activity tolerance, decreased mobility, decreased ROM, decreased strength, and impaired perceived functional ability.   ACTIVITY LIMITATIONS: carrying, lifting, bending, sitting, and squatting  PARTICIPATION LIMITATIONS: meal prep, driving, community activity, and occupation  PERSONAL FACTORS: Past/current experiences and Time since onset of injury/illness/exacerbation are also affecting patient's functional outcome.   REHAB POTENTIAL: Excellent  CLINICAL DECISION MAKING: Stable/uncomplicated  EVALUATION COMPLEXITY: Low   GOALS: Goals reviewed with patient? Yes  SHORT TERM GOALS: Target date: 11/29/22 Pt will demonstrate ability to perform squat and sit with optimal lumbopelvic body mechanics Baseline: posterior pelvic tilt/reduced lumbar lordosis, spine dominant squat technique Goal status: INITIAL   LONG TERM GOALS: Target date: 12/31/22  Improve FOTO to >72 indicating improved ability to perform her daily activities  without being limited by lumbopelvic pain Baseline: 53 Goal status: INITIAL  2.  Pt will be able to ride in car x 1 hour with <2/10  back/hip pain to drive to client's homes as a mobile dog groomer Baseline:  Goal status: INITIAL  3.  Pt will be able to lift >30 lbs to be able to carry her grooming table in/out of client's homes independently for her job Baseline: having difficulty, spouse assists Goal status: INITIAL   PLAN:  PT FREQUENCY: 2x/week  PT DURATION: 6 weeks  PLANNED INTERVENTIONS: Therapeutic exercises, Therapeutic activity, Neuromuscular re-education, Balance  training, Gait training, Patient/Family  education, Self Care, and Joint mobilization  PLAN FOR NEXT SESSION: motor control retraining lumbopelvic region, manual therapy for MTPs, body mechanics training for sitting, standing, squatting, car rides  Max Fickle, PT, DPT, OCS  [phone]  Ardine Bjork, PT 12/12/2022, 2:55 PM

## 2022-12-17 ENCOUNTER — Ambulatory Visit: Payer: Medicaid Other

## 2022-12-19 ENCOUNTER — Ambulatory Visit: Payer: Medicaid Other

## 2022-12-19 DIAGNOSIS — M533 Sacrococcygeal disorders, not elsewhere classified: Secondary | ICD-10-CM

## 2022-12-19 DIAGNOSIS — M25552 Pain in left hip: Secondary | ICD-10-CM | POA: Diagnosis not present

## 2022-12-19 DIAGNOSIS — M5459 Other low back pain: Secondary | ICD-10-CM

## 2022-12-19 NOTE — Therapy (Signed)
OUTPATIENT PHYSICAL THERAPY LOWER EXTREMITY/LUMBAR TREATMENT   Patient Name: Jasmine Price MRN: [id number] DOB:1995-06-07, 27 y.o., female Today's Date: 12/20/2022  END OF SESSION:  PT End of Session - 12/20/22 1433     Visit Number 6    Number of Visits 9    Date for PT Re-Evaluation 12/31/22    Authorization Type 7/22-9/19 for 8 PT visits  Healthy [facility] 2024    Activity Tolerance Patient tolerated treatment well;No increased pain    Behavior During Therapy [facility] for tasks assessed/performed              Past Medical History:  Diagnosis Date   Anxiety    Depression    HIV disease (HCC)    UNDETECTABLE   Pregnancy, ectopic, cornual or cervical 08/06/2021   Left wedge resection of cornual ectopic pregnancy   Past Surgical History:  Procedure Laterality Date   TYMPANOSTOMY TUBE PLACEMENT Right    2013 and previously as a child   WISDOM TOOTH EXTRACTION  2021   three;   XI ROBOTIC ASSISTED SALPINGECTOMY Left 08/06/2021   Procedure: XI ROBOTIC ASSISTED  Removal of Cornual Pregnancy;  Surgeon: Natale Milch, MD;  Location: [facility] ORS;  Service: Gynecology;  Laterality: Left;   Patient Active Problem List   Diagnosis Date Noted   Supervision of other normal pregnancy, antepartum 08/01/2022   Abnormal uterine bleeding (AUB) 04/16/2022   Ectopic pregnancy without intrauterine pregnancy- Corunal ectopic Left      PCP: same   REFERRING PROVIDER: Nadyne Coombes, MD  REFERRING DIAG: L hip pain  THERAPY DIAG:  Pain in joint involving pelvic region and thigh, left  Other low back pain  Sacroiliac pain  Rationale for Evaluation and Treatment: Rehabilitation  ONSET DATE: April 2024  SUBJECTIVE: (From initial evaluation note 11/14/22)  SUBJECTIVE STATEMENT: April 2024- 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  She reports she has been having low back and L hip pain prior to the fall too; but this recent episode resulted in a flare  up of sx.  She reports the sx are L>R SI type pain that refers to her lateral hip area.  She does not report tingling/numbness, more of an "achy and sore".    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    She has a 29 y/o and 29 y/o; vaginal deliveries; has at least 4 pregnancies   PAIN:  Are you having pain? Yes, 3-5/10  PRECAUTIONS: None  RED FLAGS: None Has SUI- has attended pelvic floor PT to address this in the past year  WEIGHT BEARING RESTRICTIONS: No  FALLS:  Has patient fallen in last 6 months? Yes. 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  PLOF: Independent  PATIENT GOALS: Pt reports she would like to lessen the pain and to learn how to handle the sx when she feels painful  NEXT MD VISIT: 12/05/22  OBJECTIVE:   DIAGNOSTIC FINDINGS: none for hip or back  PATIENT SURVEYS:  FOTO 59/73  COGNITION: Overall cognitive status: Within functional limits for tasks assessed     SENSATION: WFL   MUSCLE LENGTH: Normal proximal/distal hamstring length, normal quadriceps length  POSTURE: decreased lumbar lordosis  PALPATION: TTP along L>R lumbar spine paraspinal mm (+) TTP with CPA L4-5, reproduces typical sx (-)  reproduction of sx with sacral PA mob  LOWER EXTREMITY ROM:  Active ROM Right eval Left eval  Hip flexion 115 115  Hip extension 5 5  Hip abduction    Hip adduction    Hip internal rotation 15 15  Hip external rotation 35 30  Knee flexion    Knee extension    Ankle dorsiflexion    Ankle plantarflexion    Ankle inversion    Ankle eversion     (Blank rows = not tested)  LOWER EXTREMITY MMT:  MMT Right eval Left eval  Hip flexion 4 4  Hip extension 4 4  Hip abduction 4 4  Hip adduction    Hip internal rotation    Hip external rotation    Knee flexion    Knee extension    Ankle dorsiflexion    Ankle plantarflexion    Ankle inversion    Ankle eversion     (Blank rows = not tested)  LOWER EXTREMITY SPECIAL TESTS:  (-) FADDIR test R/L LE + lumbar spine AROM limitations with flexion, extension, side bend R/L + ASLR test L/R  FUNCTIONAL TESTS:  Pt able to transition from sit to stand, and stand to sit/sidelying/supine independently; demonstrates ability to perform squat  GAIT: Amb into PT clinic without AD   TODAY'S TREATMENT:                                                                                                                              DATE: 12/19/22  Subjective: Pt reports overall PT is going well.  She is having some tightness/stiffness/pain in upper back.  Mid back spasms still happening.  She is trying to work on lifting technique while grooming dogs.  Took a bike ride with her family this week and that felt good.  Pain: 0/10 upon arrival  Objective: (+) MTPs in b/l rhomboids, levator scap, upper trap; pt with limited overhead shoulder flexion AROM  b/l and thoracic rotation b/l due to MTPs  Manual Therapy: (10 min) STM to address (+) MTPs in b/l rhomboids, levator scap, upper trap S-T mobs all directions/manual rhomboid stretch b/l Pt reprots mproved ability to perform overhead reach and thoracic rotation without noting  "stiffness/pain"  Therapeutic Exercise:  Pt ed how to perform self MTP release using firm lacrosse ball standing at wall and adding small movements, direct ischemic pressure,  or active release with UE motion; pt practiced all techniques  Hooklying SKTC 3x30sec bilat, gentle FABER stretch 3x30sec bilat Hooklying lowertrunk rotations x knee spacer 10x10sec, alternating Hip FABER stretch: 30 sec ea  Supine alternating shoulder extension (for scapulothoracic mm mobility) x10 ea Supine shoulder/scapular horizontal abd/add x10 ea (for scapulothoracic mobility) Supine scapular protraction/slow lower x10  Supine hooklying bridge: 2x12, focused on breathing during movement Bridge+ hip abd (blue TB) 2x10 Bridge + hip add (ball) 2x8 Supine hooklying TA activation + alt LE march 2x10 Hooklying exhale+ palm press into thigh isometric  Squat technique:  10 lb medicine ball front squat: 2x12 10 lb medicine ball front squat to overhead press: x15 10 lb medicine ball front squat to shoulder 90 deg flex standing chest press: x15  Not today: Medicine ball overhead hold + alternate marching 1x20 (hooklying)  20lb cable upward chop diagonal x10 bilat  Prone on elbows x 2 min Sidelying open book thoracolumbar rotation 10x ea Standing countertop "L" stretch x5- discussed how to use this and scapular retractions and standing with foot propped on step stool as "movement breaks" during grooming sessions to change positions from being in constant static slightly flexed position at spine for long periods of time. Sx management technique/body mechanics training and education.  PATIENT EDUCATION:  Education details: PT POC/goals/HEP/body mechanics/exercise technique Person educated: Patient Education method: Explanation Education comprehension: verbalized understanding  HOME EXERCISE PROGRAM: Access Code: [email or web address] URL: [email or web address] Date: 12/12/2022 Prepared by: Max Fickle  Exercises - Supine Lower Trunk Rotation  - 2 x daily - 7 x weekly - 10 reps - Sidelying Thoracic Rotation with Open Book  - 2 x daily - 7 x weekly - 10 reps - Static Prone on Elbows  - 2 x daily - 7 x weekly - 1 reps - 2 min hold - Supine Transversus Abdominis Bracing - Hands on Stomach  - 2 x daily - 7 x weekly - 2 sets - 10 reps - Supine Bridge  - 2 x daily - 7 x weekly - 2 sets - 10 reps - Cat Cow to Child's Pose  - 1 x daily - 7 x weekly - 10 reps - Standing 'L' Stretch at Counter  - 1 x daily - 7 x weekly - 10 reps  ASSESSMENT:  CLINICAL IMPRESSION: Pt overall tolerated gentle stretching and core mm retraining well today.  She responded well to manual therapy to address scapulothoracic mm MTPs, with improved ROM and decreased stiffness/pain noted after tx.  I instructed pt on self MTP release techniques for these areas and the benefit of performing this technique for sx management at home.  Continued to work on squat technique and various squat scenarios today for strengthening.  She is an appropriate candidate for PT and should do well with treatment as her impairments are influencing her ability to perform her daily activities as a dog groomer and at home.  OBJECTIVE IMPAIRMENTS: decreased activity tolerance, decreased mobility, decreased ROM, decreased strength, and impaired perceived functional ability.   ACTIVITY LIMITATIONS: carrying, lifting, bending, sitting, and squatting  PARTICIPATION LIMITATIONS: meal prep, driving, community activity, and occupation  PERSONAL FACTORS: Past/current experiences and Time since onset of injury/illness/exacerbation are also affecting patient's functional outcome.   REHAB POTENTIAL: Excellent  CLINICAL DECISION MAKING: Stable/uncomplicated  EVALUATION COMPLEXITY: Low   GOALS: Goals reviewed with patient? Yes  SHORT TERM GOALS: Target date: 11/29/22 Pt will demonstrate ability to perform squat and sit with optimal lumbopelvic  body mechanics Baseline: posterior  pelvic tilt/reduced lumbar lordosis, spine dominant squat technique Goal status: INITIAL   LONG TERM GOALS: Target date: 12/31/22  Improve FOTO to >72 indicating improved ability to perform her daily activities  without being limited by lumbopelvic pain Baseline: 53 Goal status: INITIAL  2.  Pt will be able to ride in car x 1 hour with <2/10  back/hip pain to drive to client's homes as a mobile dog groomer Baseline:  Goal status: INITIAL  3.  Pt will be able to lift >30 lbs to be able to carry her grooming table in/out of client's homes independently for her job Baseline: having difficulty, spouse assists Goal status: INITIAL   PLAN:  PT FREQUENCY: 2x/week  PT DURATION: 6 weeks  PLANNED INTERVENTIONS: Therapeutic exercises, Therapeutic activity, Neuromuscular re-education, Balance training, Gait training, Patient/Family education, Self Care, and Joint mobilization  PLAN FOR NEXT SESSION: motor control retraining lumbopelvic region, manual therapy for MTPs, body mechanics training for sitting, standing, squatting, car rides  Max Fickle, PT, DPT, OCS  #[id number]  Ardine Bjork, PT 12/20/2022, 2:47 PM

## 2022-12-26 ENCOUNTER — Ambulatory Visit: Payer: Medicaid Other

## 2022-12-26 DIAGNOSIS — M25552 Pain in left hip: Secondary | ICD-10-CM | POA: Diagnosis not present

## 2022-12-26 DIAGNOSIS — M5459 Other low back pain: Secondary | ICD-10-CM

## 2022-12-26 NOTE — Therapy (Signed)
OUTPATIENT PHYSICAL THERAPY LOWER EXTREMITY/LUMBAR TREATMENT   Patient Name: Jasmine Price MRN: [id number] DOB:March 03, 1996, 27 y.o., female Today's Date: 12/27/2022  END OF SESSION:  PT End of Session - 12/26/22 2109     Visit Number 7    Number of Visits 9    Date for PT Re-Evaluation 12/31/22    Authorization Type 7/22-9/19 for 8 PT visits  Healthy [facility] 2024    PT Start Time 1245    PT Stop Time 1330    PT Time Calculation (min) 45 min    Activity Tolerance Patient tolerated treatment well;No increased pain    Behavior During Therapy [facility] for tasks assessed/performed               Past Medical History:  Diagnosis Date   Anxiety    Depression    HIV disease (HCC)    UNDETECTABLE   Pregnancy, ectopic, cornual or cervical 08/06/2021   Left wedge resection of cornual ectopic pregnancy   Past Surgical History:  Procedure Laterality Date   TYMPANOSTOMY TUBE PLACEMENT Right    2013 and previously as a child   WISDOM TOOTH EXTRACTION  2021   three;   XI ROBOTIC ASSISTED SALPINGECTOMY Left 08/06/2021   Procedure: XI ROBOTIC ASSISTED  Removal of Cornual Pregnancy;  Surgeon: Natale Milch, MD;  Location: [facility] ORS;  Service: Gynecology;  Laterality: Left;   Patient Active Problem List   Diagnosis Date Noted   Supervision of other normal pregnancy, antepartum 08/01/2022   Abnormal uterine bleeding (AUB) 04/16/2022   Ectopic pregnancy without intrauterine pregnancy- Corunal ectopic Left      PCP: same   REFERRING PROVIDER: Nadyne Coombes, MD  REFERRING DIAG: L hip pain  THERAPY DIAG:  Pain in joint involving pelvic region and thigh, left  Other low back pain  Rationale for Evaluation and Treatment: Rehabilitation  ONSET DATE: April 2024  SUBJECTIVE: (From initial evaluation note 11/14/22)  SUBJECTIVE STATEMENT: April 2024- 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  She reports she has been having low back and L hip pain  prior to the fall too; but this recent episode resulted in a flare up of sx.  She reports the sx are L>R SI type pain that refers to her lateral hip area.  She does not report tingling/numbness, more of an "achy and sore".    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    She has a 29 y/o and 29 y/o; vaginal deliveries; has at least 4 pregnancies   PAIN:  Are you having pain? Yes, 3-5/10  PRECAUTIONS: None  RED FLAGS: None Has SUI- has attended pelvic floor PT to address this in the past year  WEIGHT BEARING RESTRICTIONS: No  FALLS:  Has patient fallen in last 6 months? Yes. 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   PLOF: Independent  PATIENT GOALS: Pt reports she would like to lessen the pain and to learn how to handle the sx when she feels painful  NEXT MD VISIT: 12/05/22  OBJECTIVE:   DIAGNOSTIC FINDINGS: none for hip or back  PATIENT SURVEYS:  FOTO 59/73  COGNITION: Overall cognitive status: Within functional limits for tasks assessed     SENSATION: WFL   MUSCLE LENGTH: Normal proximal/distal hamstring length, normal quadriceps length  POSTURE: decreased lumbar lordosis  PALPATION: TTP along L>R lumbar spine  paraspinal mm (+) TTP with CPA L4-5, reproduces typical sx (-) reproduction of sx with sacral PA mob  LOWER EXTREMITY ROM:  Active ROM Right eval Left eval  Hip flexion 115 115  Hip extension 5 5  Hip abduction    Hip adduction    Hip internal rotation 15 15  Hip external rotation 35 30  Knee flexion    Knee extension    Ankle dorsiflexion    Ankle plantarflexion    Ankle inversion    Ankle eversion     (Blank rows = not tested)  LOWER EXTREMITY MMT:  MMT Right eval Left eval  Hip flexion 4 4  Hip extension 4 4  Hip abduction 4 4  Hip adduction    Hip internal rotation    Hip external rotation    Knee flexion    Knee extension    Ankle dorsiflexion    Ankle plantarflexion    Ankle inversion    Ankle eversion     (Blank rows = not tested)  LOWER EXTREMITY SPECIAL TESTS:  (-) FADDIR test R/L LE + lumbar spine AROM limitations with flexion, extension, side bend R/L + ASLR test L/R  FUNCTIONAL TESTS:  Pt able to transition from sit to stand, and stand to sit/sidelying/supine independently; demonstrates ability to perform squat  GAIT: Amb into PT clinic without AD   TODAY'S TREATMENT:                                                                                                                              DATE: 12/26/22  Subjective: Pt reports overall PT is going well.  She feels like the stretches and MTP self care work helps upper back stiffness.  Overall, had a very busy week.   Pain: 0/10 upon arrival  Objective:  Therapeutic Exercise:  Pt ed how to perform self MTP release using firm lacrosse ball standing at wall and adding small movements, direct ischemic pressure, or active release with UE motion; pt practiced all techniques  Hooklying SKTC 3x30sec bilat, gentle FABER stretch 3x30sec bilat Hooklying lowertrunk rotations x knee spacer 10x10sec, alternating Hip FABER stretch: 30 sec ea  Supine hooklying bridge: 2x12, focused on breathing during  movement and TA activation Squat technique:  10 lb medicine ball front squat: 2x12 10 lb medicine  ball front squat to overhead press: x15 10 lb medicine ball front squat to shoulder 90 deg flex standing chest press: x15  Functional Push via standing chest press- Nautilus machine x 30#, 2x12 with emphasis on neutral spine and TA activation  Functional Pull via standing row with "ready stance legs"- Nautilus machine x 30#, 2x15 with emphasis on neutral spine and TA activation  Pt ed for how to use TA activation during functional push/pull movements in her typical daily routine; also discussed how to recreate these exercises at [employer] as she is considering returning to gym when she finishes PT  Not today: Supine alternating shoulder extension (for scapulothoracic mm mobility) x10 ea Supine shoulder/scapular horizontal abd/add x10 ea (for scapulothoracic mobility) Supine scapular protraction/slow lower x10 Medicine ball overhead hold + alternate marching 1x20 (hooklying)  20lb cable upward chop diagonal x10 bilat  Prone on elbows x 2 min Sidelying open book thoracolumbar rotation 10x ea Standing countertop "L" stretch x5- discussed how to use this and scapular retractions and standing with foot propped on step stool as "movement breaks" during grooming sessions to change positions from being in constant static slightly flexed position at spine for long periods of time. Sx management technique/body mechanics training and education. Manual Therapy: (10 min) not today STM to address (+) MTPs in b/l rhomboids, levator scap, upper trap S-T mobs all directions/manual rhomboid stretch b/l Pt reprots mproved ability to perform overhead reach and thoracic rotation without noting "stiffness/pain"   PATIENT EDUCATION:  Education details: PT POC/goals/HEP/body mechanics/exercise technique Person educated: Patient Education method: Explanation Education comprehension: verbalized  understanding  HOME EXERCISE PROGRAM: Access Code: [email or web address] URL: [email or web address] Date: 12/12/2022 Prepared by: Max Fickle  Exercises - Supine Lower Trunk Rotation  - 2 x daily - 7 x weekly - 10 reps - Sidelying Thoracic Rotation with Open Book  - 2 x daily - 7 x weekly - 10 reps - Static Prone on Elbows  - 2 x daily - 7 x weekly - 1 reps - 2 min hold - Supine Transversus Abdominis Bracing - Hands on Stomach  - 2 x daily - 7 x weekly - 2 sets - 10 reps - Supine Bridge  - 2 x daily - 7 x weekly - 2 sets - 10 reps - Cat Cow to Child's Pose  - 1 x daily - 7 x weekly - 10 reps - Standing 'L' Stretch at Counter  - 1 x daily - 7 x weekly - 10 reps  ASSESSMENT:  CLINICAL IMPRESSION: Pt overall tolerated progression of neutral spine position core mm retraining today without c/o increased LBP at end of session.  Discussed how to functionally the retraining worked on during today's session into her daily routine as a Research scientist (medical).  She is an appropriate candidate for PT and should do well with treatment as her impairments are influencing her ability to perform her daily activities as a dog groomer and at home.  OBJECTIVE IMPAIRMENTS: decreased activity tolerance, decreased mobility, decreased ROM, decreased strength, and impaired perceived functional ability.   ACTIVITY LIMITATIONS: carrying, lifting, bending, sitting, and squatting  PARTICIPATION LIMITATIONS: meal prep, driving, community activity, and occupation  PERSONAL FACTORS: Past/current experiences and Time since onset of injury/illness/exacerbation are also affecting patient's functional outcome.   REHAB POTENTIAL: Excellent  CLINICAL DECISION MAKING: Stable/uncomplicated  EVALUATION COMPLEXITY: Low   GOALS: Goals reviewed with patient? Yes  SHORT TERM GOALS: Target date: 11/29/22 Pt will demonstrate ability to perform squat and sit with optimal lumbopelvic  body mechanics Baseline: posterior pelvic  tilt/reduced lumbar lordosis, spine dominant squat technique Goal status: INITIAL   LONG TERM GOALS: Target date: 12/31/22  Improve FOTO to >72 indicating improved ability to perform her daily activities  without being limited by lumbopelvic pain Baseline: 53 Goal status: INITIAL  2.  Pt will be able to ride in car x 1 hour with <2/10  back/hip pain to drive to client's homes as a mobile dog groomer Baseline:  Goal status: INITIAL  3.  Pt will be able to lift >30 lbs to be able to carry her grooming table in/out of client's homes independently for her job Baseline: having difficulty, spouse assists Goal status: INITIAL   PLAN:  PT FREQUENCY: 2x/week  PT DURATION: 6 weeks  PLANNED INTERVENTIONS: Therapeutic exercises, Therapeutic activity, Neuromuscular re-education, Balance training, Gait training, Patient/Family education, Self Care, and Joint mobilization  PLAN FOR NEXT SESSION: motor control retraining lumbopelvic region, manual therapy for MTPs, body mechanics training for sitting, standing, squatting, car rides  Max Fickle, PT, DPT, OCS  #[id number]  Ardine Bjork, PT 12/27/2022, 9:16 PM

## 2023-01-02 ENCOUNTER — Ambulatory Visit: Payer: Medicaid Other | Attending: Internal Medicine

## 2023-01-02 DIAGNOSIS — M25552 Pain in left hip: Secondary | ICD-10-CM | POA: Insufficient documentation

## 2023-01-02 DIAGNOSIS — M533 Sacrococcygeal disorders, not elsewhere classified: Secondary | ICD-10-CM | POA: Insufficient documentation

## 2023-01-02 DIAGNOSIS — M5459 Other low back pain: Secondary | ICD-10-CM | POA: Insufficient documentation

## 2023-01-02 NOTE — Therapy (Signed)
OUTPATIENT PHYSICAL THERAPY LOWER EXTREMITY/LUMBAR TREATMENT PROGRESS NOTE   Patient Name: Jasmine Price MRN: [id number] DOB:1995/12/02, 27 y.o., female Today's Date: 01/02/2023  END OF SESSION:  PT End of Session - 01/02/23 1258     Visit Number 8    Number of Visits 9    Date for PT Re-Evaluation 12/31/22    Authorization Type 7/22-9/19 for 8 PT visits  Healthy [facility] 2024    PT Start Time 1245    PT Stop Time 1330    PT Time Calculation (min) 45 min    Activity Tolerance Patient tolerated treatment well;No increased pain    Behavior During Therapy [facility] for tasks assessed/performed               Past Medical History:  Diagnosis Date   Anxiety    Depression    HIV disease (HCC)    UNDETECTABLE   Pregnancy, ectopic, cornual or cervical 08/06/2021   Left wedge resection of cornual ectopic pregnancy   Past Surgical History:  Procedure Laterality Date   TYMPANOSTOMY TUBE PLACEMENT Right    2013 and previously as a child   WISDOM TOOTH EXTRACTION  2021   three;   XI ROBOTIC ASSISTED SALPINGECTOMY Left 08/06/2021   Procedure: XI ROBOTIC ASSISTED  Removal of Cornual Pregnancy;  Surgeon: Natale Milch, MD;  Location: [facility] ORS;  Service: Gynecology;  Laterality: Left;   Patient Active Problem List   Diagnosis Date Noted   Supervision of other normal pregnancy, antepartum 08/01/2022   Abnormal uterine bleeding (AUB) 04/16/2022   Ectopic pregnancy without intrauterine pregnancy- Corunal ectopic Left      PCP: same   REFERRING PROVIDER: Nadyne Coombes, MD  REFERRING DIAG: L hip pain  THERAPY DIAG:  Pain in joint involving pelvic region and thigh, left  Other low back pain  Sacroiliac pain  Rationale for Evaluation and Treatment: Rehabilitation  ONSET DATE: April 2024  SUBJECTIVE: (From initial evaluation note 11/14/22)  SUBJECTIVE STATEMENT: April 2024- 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  She reports she has been  having low back and L hip pain prior to the fall too; but this recent episode resulted in a flare up of sx.  She reports the sx are L>R SI type pain that refers to her lateral hip area.  She does not report tingling/numbness, more of an "achy and sore".    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    She has a 29 y/o and 29 y/o; vaginal deliveries; has at least 4 pregnancies   PAIN:  Are you having pain? Yes, 3-5/10  PRECAUTIONS: None  RED FLAGS: None Has SUI- has attended pelvic floor PT to address this in the past year  WEIGHT BEARING RESTRICTIONS: No  FALLS:  Has patient fallen in last 6 months? Yes. 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   PLOF: Independent  PATIENT GOALS: Pt reports she would like to lessen the pain and to learn how to handle the sx when she feels painful  NEXT MD VISIT: 12/05/22  OBJECTIVE:   DIAGNOSTIC FINDINGS: none for hip or back  PATIENT SURVEYS:  FOTO 59/73  COGNITION: Overall cognitive status: Within functional limits for tasks assessed     SENSATION: WFL   MUSCLE LENGTH: Normal proximal/distal hamstring length, normal quadriceps length  POSTURE: decreased lumbar  lordosis  PALPATION: TTP along L>R lumbar spine paraspinal mm (+) TTP with CPA L4-5, reproduces typical sx (-) reproduction of sx with sacral PA mob  LOWER EXTREMITY ROM:  Active ROM Right eval Left eval  Hip flexion 115 115  Hip extension 5 5  Hip abduction    Hip adduction    Hip internal rotation 15 15  Hip external rotation 35 30  Knee flexion    Knee extension    Ankle dorsiflexion    Ankle plantarflexion    Ankle inversion    Ankle eversion     (Blank rows = not tested)  LOWER EXTREMITY MMT:  MMT Right eval Left eval  Hip flexion 4 4  Hip extension 4 4  Hip abduction 4 4  Hip adduction    Hip internal rotation    Hip external rotation    Knee flexion    Knee extension    Ankle dorsiflexion    Ankle plantarflexion    Ankle inversion    Ankle eversion     (Blank rows = not tested)  LOWER EXTREMITY SPECIAL TESTS:  (-) FADDIR test R/L LE + lumbar spine AROM limitations with flexion, extension, side bend R/L + ASLR test L/R  FUNCTIONAL TESTS:  Pt able to transition from sit to stand, and stand to sit/sidelying/supine independently; demonstrates ability to perform squat  GAIT: Amb into PT clinic without AD   TODAY'S TREATMENT:                                                                                                                              DATE: 01/02/23  Subjective: Pt reports overall PT is going well.  Functionally she is able to ride in the car more easily.  She also reports being able to lift the dogs/cats and her equipment more easily for work as a International aid/development worker.  She hopes to return to [employer] in the future for her own exercise program.   Pain: 0/10 upon arrival  Objective: Update goals- see below  Therapeutic Exercise:  Scifit x 10 min, level 4 (to simulate bike riding as this is an activity that is important to her and she recently bought a bike)  Squat technique:  Chair squats: 2x15 10 lb medicine ball front squat: 2x12- not  today 10 lb medicine ball front  squat to overhead press: x15 10 lb medicine ball front squat to shoulder 90 deg flex standing chest press: x15  Functional Push via standing chest press- Nautilus machine x 30#, 2x12 with emphasis on neutral spine and TA activation  Functional Pull via standing row with "ready stance legs"- Nautilus machine x 30#, 2x15 with emphasis on neutral spine and TA activation  Pallof Press with Green TB: 2x12 each side  Pt ed for how to use TA activation during functional push/pull movements in her typical daily routine; also discussed how to recreate these exercises at [employer] as she is considering returning to gym when she finishes PT  Updated HEP- see below, gave pt green TB  PATIENT EDUCATION:  Education details: PT POC/goals/HEP/body mechanics/exercise technique Person educated: Patient Education method: Explanation Education comprehension: verbalized understanding  HOME EXERCISE PROGRAM: Access Code: [email or web address] URL: [email or web address] Date: 01/02/2023 Prepared by: Max Fickle  Exercises - Supine Lower Trunk Rotation  - 2 x daily - 7 x weekly - 10 reps - Sidelying Thoracic Rotation with Open Book  - 2 x daily - 7 x weekly - 10 reps - Static Prone on Elbows  - 2 x daily - 7 x weekly - 1 reps - 2 min hold - Supine Transversus Abdominis Bracing - Hands on Stomach  - 2 x daily - 7 x weekly - 2 sets - 10 reps - Supine Bridge  - 2 x daily - 7 x weekly - 2 sets - 10 reps - Cat Cow to Child's Pose  - 1 x daily - 7 x weekly - 10 reps - Standing 'L' Stretch at Counter  - 1 x daily - 7 x weekly - 10 reps - Standing Anti-Rotation Press with Anchored Resistance  - 1 x daily - 7 x weekly - 2 sets - 10 reps - Squat with Chair Touch  - 1 x daily - 7 x weekly - 2 sets - 10 reps  ASSESSMENT:  CLINICAL IMPRESSION: Pt is making good progress with PT; she tolerated progression of core mm retraining well today without c/o increased LBP during or  after session.  She demonstrates ability to perform squat and squat/lift from floor to waist and floor to overhead with good spine/LE body mechanics today.  Planning to progress with strengthening at next session to facilitate pt having sufficient strength in lumbopelvic/lower extremity region to be able to perform all her necessary activities including lifting, carrying equipment and animals as a dog groomer.  OBJECTIVE IMPAIRMENTS: decreased activity tolerance, decreased mobility, decreased ROM, decreased strength, and impaired perceived functional ability.   ACTIVITY LIMITATIONS: carrying, lifting, bending, sitting, and squatting  PARTICIPATION LIMITATIONS: meal prep, driving, community activity, and occupation  PERSONAL FACTORS: Past/current experiences and Time since onset of injury/illness/exacerbation are also affecting patient's functional outcome.   REHAB POTENTIAL: Excellent  CLINICAL DECISION MAKING: Stable/uncomplicated  EVALUATION COMPLEXITY: Low   GOALS: Goals reviewed with patient? Yes  SHORT TERM GOALS: Target date: 11/29/22 Pt will demonstrate ability to perform squat and sit with optimal lumbopelvic body mechanics Baseline: posterior pelvic tilt/reduced lumbar lordosis, spine dominant squat technique; 01/02/23: able to perform Goal status: MET   LONG TERM GOALS: Target date: 12/31/22  Improve FOTO to >72 indicating improved ability to perform her daily activities  without being limited by lumbopelvic pain Baseline: 53; 01/02/23: in progress Goal status: INITIAL  2.  Pt will be able to ride in car x 1 hour with <2/10  back/hip pain to drive to client's homes as  a mobile dog groomer Baseline: 01/02/23 met Goal status: MET  3.  Pt will be able to lift >30 lbs to be able to carry her grooming table in/out of client's homes independently for her job Baseline: having difficulty, spouse assists; 12/31/22 pt lifting 10 lbs with good form in clinic Goal status: In  progress   PLAN:  PT FREQUENCY: 2x/week  PT DURATION: 6 weeks  PLANNED INTERVENTIONS: Therapeutic exercises, Therapeutic activity, Neuromuscular re-education, Balance training, Gait training, Patient/Family education, Self Care, and Joint mobilization  PLAN FOR NEXT SESSION: motor control retraining lumbopelvic region, manual therapy for MTPs, body mechanics training for sitting, standing, squatting, car rides  Max Fickle, PT, DPT, OCS  #[id number]  Ardine Bjork, PT 01/02/2023, 4:05 PM

## 2023-01-07 ENCOUNTER — Ambulatory Visit: Payer: Medicaid Other

## 2023-01-07 DIAGNOSIS — M25552 Pain in left hip: Secondary | ICD-10-CM

## 2023-01-07 DIAGNOSIS — M5459 Other low back pain: Secondary | ICD-10-CM

## 2023-01-07 NOTE — Therapy (Signed)
OUTPATIENT PHYSICAL THERAPY LOWER EXTREMITY/LUMBAR TREATMENT PROGRESS NOTE/RE-CERTIFICATION NOTE through 02/04/23   Patient Name: Jasmine Price MRN: [id number] DOB:1996-01-01, 27 y.o., female Today's Date: 01/07/2023  END OF SESSION:  PT End of Session - 01/07/23 1252     Visit Number 9    Number of Visits 9    Date for PT Re-Evaluation 12/31/22    Authorization Type 7/22-9/19 for 8 PT visits  Healthy [facility] 2024    PT Start Time 1245    PT Stop Time 1330    PT Time Calculation (min) 45 min    Activity Tolerance Patient tolerated treatment well;No increased pain    Behavior During Therapy [facility] for tasks assessed/performed               Past Medical History:  Diagnosis Date   Anxiety    Depression    HIV disease (HCC)    UNDETECTABLE   Pregnancy, ectopic, cornual or cervical 08/06/2021   Left wedge resection of cornual ectopic pregnancy   Past Surgical History:  Procedure Laterality Date   TYMPANOSTOMY TUBE PLACEMENT Right    2013 and previously as a child   WISDOM TOOTH EXTRACTION  2021   three;   XI ROBOTIC ASSISTED SALPINGECTOMY Left 08/06/2021   Procedure: XI ROBOTIC ASSISTED  Removal of Cornual Pregnancy;  Surgeon: Natale Milch, MD;  Location: [facility] ORS;  Service: Gynecology;  Laterality: Left;   Patient Active Problem List   Diagnosis Date Noted   Supervision of other normal pregnancy, antepartum 08/01/2022   Abnormal uterine bleeding (AUB) 04/16/2022   Ectopic pregnancy without intrauterine pregnancy- Corunal ectopic Left      PCP: same   REFERRING PROVIDER: Nadyne Coombes, MD  REFERRING DIAG: L hip pain  THERAPY DIAG:  Pain in joint involving pelvic region and thigh, left  Other low back pain  Rationale for Evaluation and Treatment: Rehabilitation  ONSET DATE: April 2024  SUBJECTIVE: (From initial evaluation note 11/14/22)  SUBJECTIVE STATEMENT: April 2024- 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  She  reports she has been having low back and L hip pain prior to the fall too; but this recent episode resulted in a flare up of sx.  She reports the sx are L>R SI type pain that refers to her lateral hip area.  She does not report tingling/numbness, more of an "achy and sore".    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    She has a 29 y/o and 29 y/o; vaginal deliveries; has at least 4 pregnancies   PAIN:  Are you having pain? Yes, 3-5/10  PRECAUTIONS: None  RED FLAGS: None Has SUI- has attended pelvic floor PT to address this in the past year  WEIGHT BEARING RESTRICTIONS: No  FALLS:  Has patient fallen in last 6 months? Yes. 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   PLOF: Independent  PATIENT GOALS: Pt reports she would like to lessen the pain and to learn how to handle the sx when she feels painful  NEXT MD VISIT: 12/05/22  OBJECTIVE:   DIAGNOSTIC FINDINGS: none for hip or back  PATIENT SURVEYS:  FOTO 59/73  COGNITION: Overall cognitive status: Within functional limits for tasks assessed     SENSATION: WFL   MUSCLE LENGTH: Normal proximal/distal hamstring length, normal quadriceps length  POSTURE: decreased  lumbar lordosis  PALPATION: TTP along L>R lumbar spine paraspinal mm (+) TTP with CPA L4-5, reproduces typical sx (-) reproduction of sx with sacral PA mob  LOWER EXTREMITY ROM:  Active ROM Right eval Left eval  Hip flexion 115 115  Hip extension 5 5  Hip abduction    Hip adduction    Hip internal rotation 15 15  Hip external rotation 35 30  Knee flexion    Knee extension    Ankle dorsiflexion    Ankle plantarflexion    Ankle inversion    Ankle eversion     (Blank rows = not tested)  LOWER EXTREMITY MMT:  MMT Right eval Left eval  Hip flexion 4 4  Hip extension 4 4  Hip abduction 4 4  Hip adduction    Hip internal rotation    Hip external rotation    Knee flexion    Knee extension    Ankle dorsiflexion    Ankle plantarflexion    Ankle inversion    Ankle eversion     (Blank rows = not tested)  LOWER EXTREMITY SPECIAL TESTS:  (-) FADDIR test R/L LE + lumbar spine AROM limitations with flexion, extension, side bend R/L + ASLR test L/R  FUNCTIONAL TESTS:  Pt able to transition from sit to stand, and stand to sit/sidelying/supine independently; demonstrates ability to perform squat  GAIT: Amb into PT clinic without AD   TODAY'S TREATMENT:                                                                                                                              DATE: 01/07/23  Subjective: Pt reports overall PT is going well.  She feels like it is helping her back feel better.    Pain: 0/10 upon arrival  Objective: Update goals- see below FOTO: 80 (expected 73 at DC)  Therapeutic Exercise:  Scifit x 10 min, level 4 (to simulate bike riding as this is an activity that is important to her and she recently bought a bike)  Squat technique:  Chair squats: 2x15 10 lb medicine ball front squat: 2x12 10 lb medicine ball front squat to overhead press: x15 10 lb medicine ball front squat to shoulder 90 deg flex standing chest press: x15- not today 10 lbs  Forward lunges- alternating x10 ea  Functional Push  via standing chest press- Nautilus machine x 30#, 2x12 with emphasis on neutral spine and TA activation  Functional Pull via standing row with "ready stance legs"- Nautilus machine x 30#, 2x15 with emphasis on neutral spine and TA activation  Pallof Press with Green TB: 2x12 each side  Pt ed for how to use TA activation during functional push/pull movements in her typical daily routine; also discussed how to recreate these exercises at [employer] as she is considering returning to gym when she finishes PT  Updated HEP- see below, gave pt green TB  PATIENT EDUCATION:  Education details: PT POC/goals/HEP/body mechanics/exercise technique Person educated: Patient Education method: Explanation Education comprehension: verbalized understanding  HOME EXERCISE PROGRAM: Access Code: [email or web address] URL: [email or web address] Date: 01/02/2023 Prepared by: Max Fickle  Exercises - Supine Lower Trunk Rotation  - 2 x daily - 7 x weekly - 10 reps - Sidelying Thoracic Rotation with Open Book  - 2 x daily - 7 x weekly - 10 reps - Static Prone on Elbows  - 2 x daily - 7 x weekly - 1 reps - 2 min hold - Supine Transversus Abdominis Bracing - Hands on Stomach  - 2 x daily - 7 x weekly - 2 sets - 10 reps - Supine Bridge  - 2 x daily - 7 x weekly - 2 sets - 10 reps - Cat Cow to Child's Pose  - 1 x daily - 7 x weekly - 10 reps - Standing 'L' Stretch at Counter  - 1 x daily - 7 x weekly - 10 reps - Standing Anti-Rotation Press with Anchored Resistance  - 1 x daily - 7 x weekly - 2 sets - 10 reps - Squat with Chair Touch  - 1 x daily - 7 x weekly - 2 sets - 10 reps  ASSESSMENT:  CLINICAL IMPRESSION: Pt is making good progress with PT; she tolerated progression of core mm retraining well today without c/o increased LBP during or after session.  She demonstrates ability to perform squat and squat/lift from floor to waist and floor to  overhead with good spine/LE body mechanics today.  Planning to progress with strengthening at next session to facilitate pt having sufficient strength in lumbopelvic/lower extremity region to be able to perform all her necessary activities including lifting, carrying equipment and animals as a dog groomer.  OBJECTIVE IMPAIRMENTS: decreased activity tolerance, decreased mobility, decreased ROM, decreased strength, and impaired perceived functional ability.   ACTIVITY LIMITATIONS: carrying, lifting, bending, sitting, and squatting  PARTICIPATION LIMITATIONS: meal prep, driving, community activity, and occupation  PERSONAL FACTORS: Past/current experiences and Time since onset of injury/illness/exacerbation are also affecting patient's functional outcome.   REHAB POTENTIAL: Excellent  CLINICAL DECISION MAKING: Stable/uncomplicated  EVALUATION COMPLEXITY: Low   GOALS: Goals reviewed with patient? Yes  SHORT TERM GOALS: Target date: 11/29/22 Pt will demonstrate ability to perform squat and sit with optimal lumbopelvic body mechanics Baseline: posterior pelvic tilt/reduced lumbar lordosis, spine dominant squat technique; 01/02/23: able to perform Goal status: MET   LONG TERM GOALS: Target date: 12/31/22  Improve FOTO to >72 indicating improved ability to perform her daily activities  without being limited by lumbopelvic pain Baseline: 53; 01/02/23: in progress Goal status: INITIAL  2.  Pt will be able to ride in car x 1 hour with <2/10  back/hip pain to drive to client's homes as a mobile dog groomer Baseline: 01/02/23 met Goal status: MET  3.  Pt will be able to lift >30 lbs to be  able to carry her grooming table in/out of client's homes independently for her job Baseline: having difficulty, spouse assists; 12/31/22 pt lifting 10 lbs with good form in clinic Goal status: In progress   PLAN:  PT FREQUENCY: 2x/week  PT DURATION: 6 weeks  PLANNED INTERVENTIONS: Therapeutic exercises,  Therapeutic activity, Neuromuscular re-education, Balance training, Gait training, Patient/Family education, Self Care, and Joint mobilization  PLAN FOR NEXT SESSION: motor control retraining lumbopelvic region, manual therapy for MTPs, body mechanics training for sitting, standing, squatting, car rides  Max Fickle, PT, DPT, OCS  Ardine Bjork, PT 01/07/2023, 12:54 PM

## 2023-01-15 ENCOUNTER — Other Ambulatory Visit: Payer: Self-pay | Admitting: Internal Medicine

## 2023-01-15 DIAGNOSIS — N63 Unspecified lump in unspecified breast: Secondary | ICD-10-CM

## 2023-01-24 ENCOUNTER — Ambulatory Visit
Admission: RE | Admit: 2023-01-24 | Discharge: 2023-01-24 | Disposition: A | Discharge status: Home / Self Care | Payer: Medicaid Other | Source: Ambulatory Visit | Attending: Internal Medicine | Admitting: Internal Medicine

## 2023-01-24 ENCOUNTER — Other Ambulatory Visit: Payer: Self-pay | Admitting: Internal Medicine

## 2023-01-24 DIAGNOSIS — N63 Unspecified lump in unspecified breast: Secondary | ICD-10-CM

## 2023-01-24 DIAGNOSIS — N6452 Nipple discharge: Secondary | ICD-10-CM

## 2023-02-12 IMAGING — CT CT ABD-PELV W/ CM
2 of 4 series · 17 of 46 positions shown, 19 images · IV contrast (APPLIED)
Comparison: Ultrasound 08/06/2021

CLINICAL DATA: Abdominal pain, acute, nonlocalized. Previous
surgery for ectopic pregnancy. Diffuse abdominal pain.

EXAM:
CT ABDOMEN AND PELVIS WITH CONTRAST
TECHNIQUE: Multidetector CT imaging of the abdomen and pelvis was performed
using the standard protocol following bolus administration of
intravenous contrast.

[Series 2: abdomen 5.0 · axial · 0.98mm/px · z∈[-926,-506]mm · 14 of 97 slices shown, 16 images]
[im 7/97  soft-tissue]
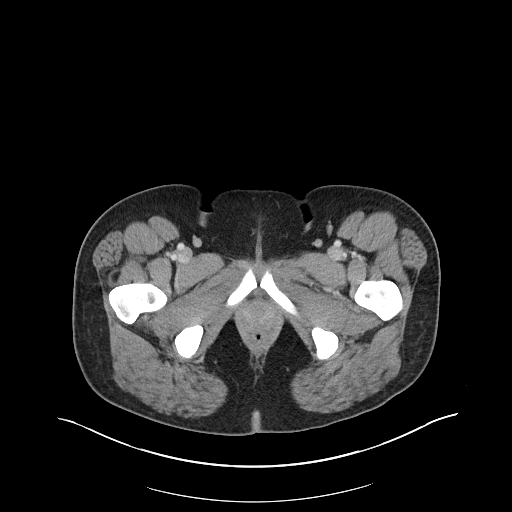
[im 7/97  bone]
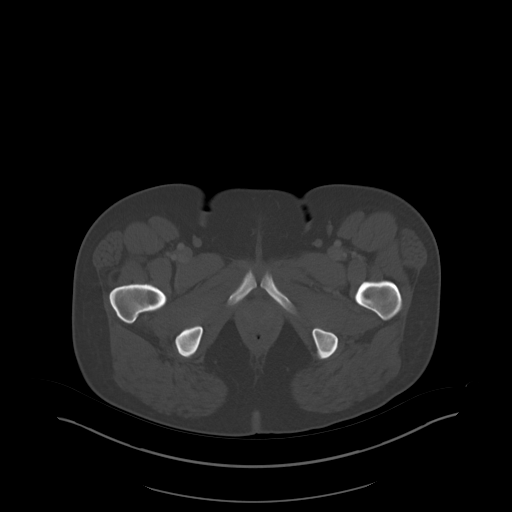
[im 13/97  soft-tissue]
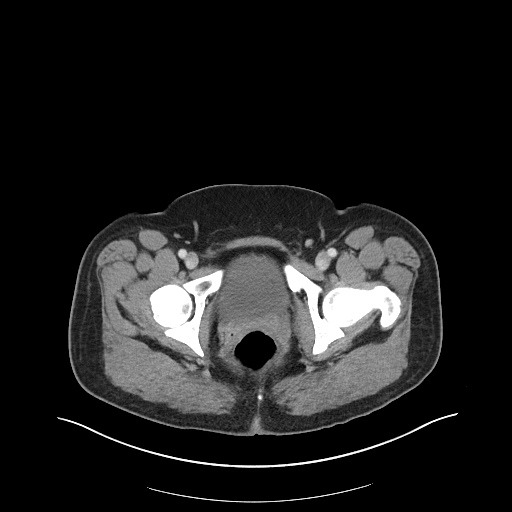
[im 19/97  soft-tissue]
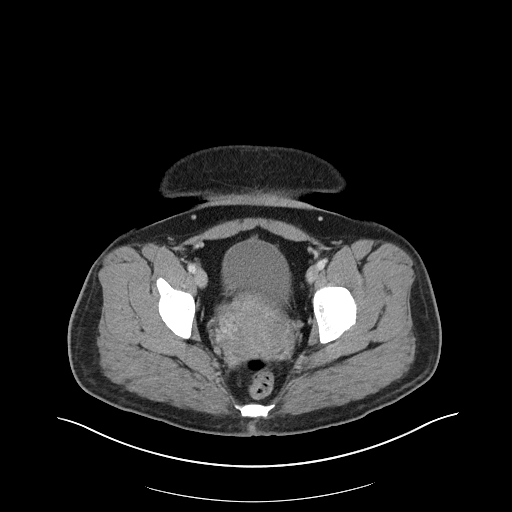
[im 25/97  soft-tissue]
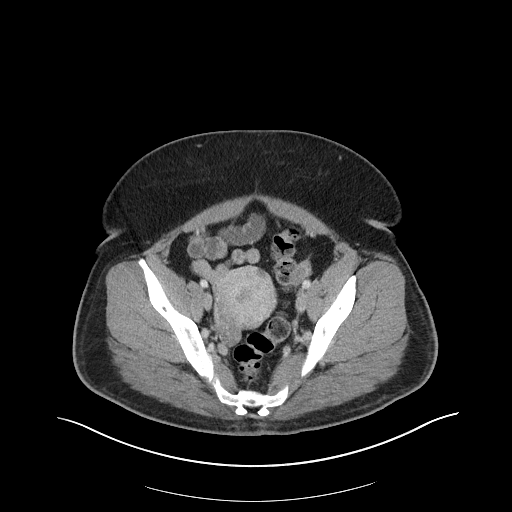
[im 31/97  soft-tissue]
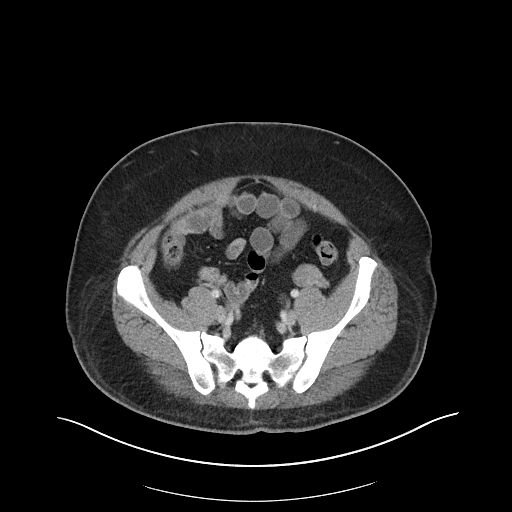
[im 37/97  soft-tissue]
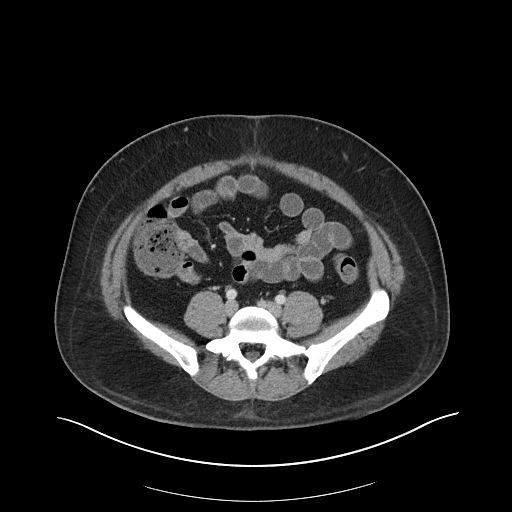
[im 43/97  soft-tissue]
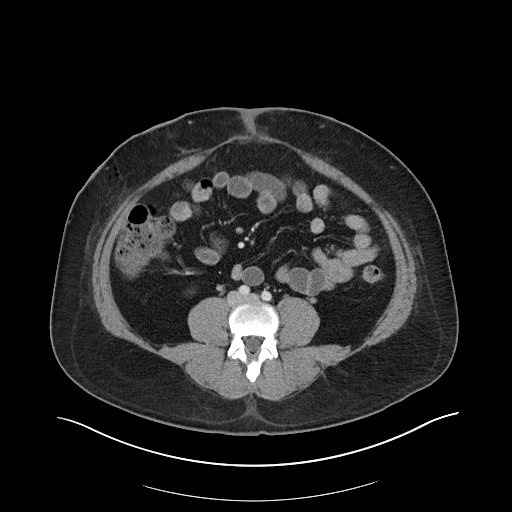
[im 55/97  soft-tissue]
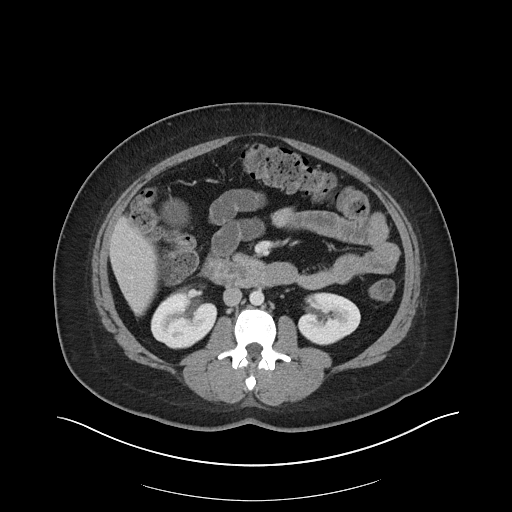
[im 61/97  soft-tissue]
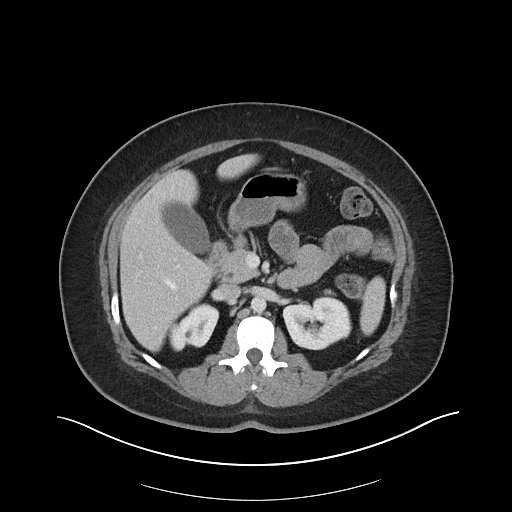
[im 61/97  bone]
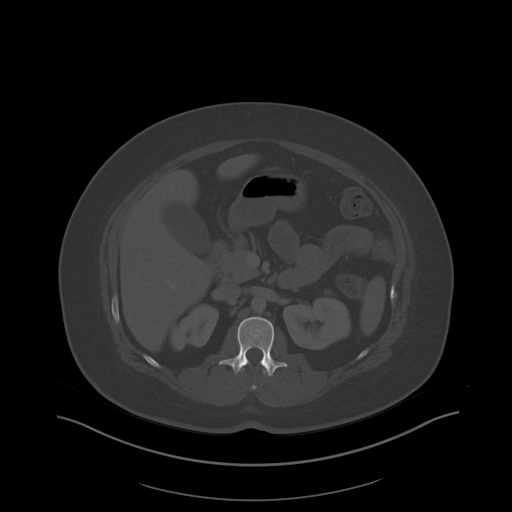
[im 67/97  soft-tissue]
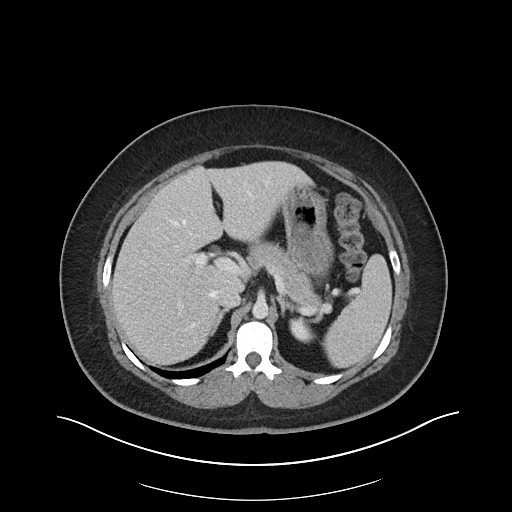
[im 73/97  soft-tissue]
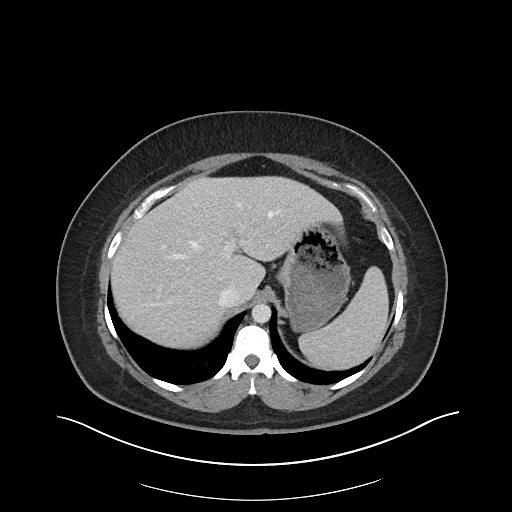
[im 79/97  soft-tissue]
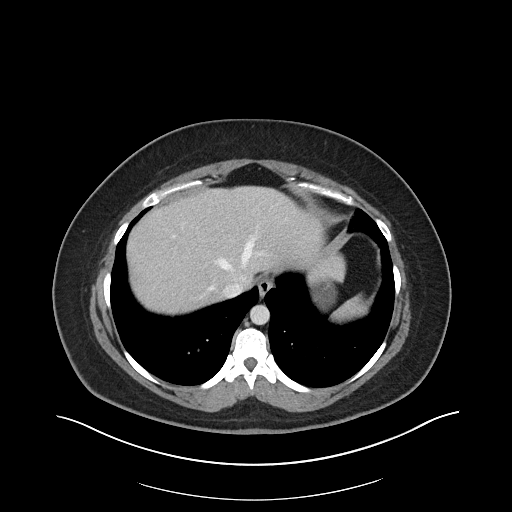
[im 85/97  soft-tissue]
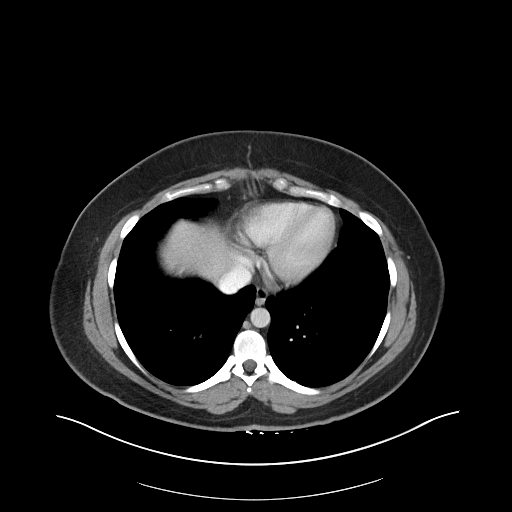
[im 91/97  soft-tissue]
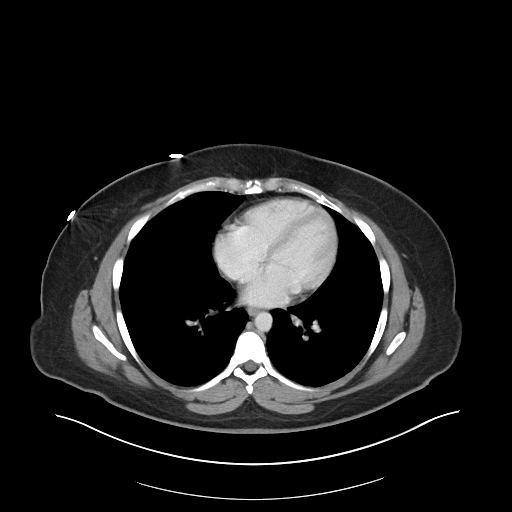

[Series 5: abdomen 3.0 mpr cor · coronal · 0.87mm/px · 3 of 115 slices shown]
[im 39/115  soft-tissue]
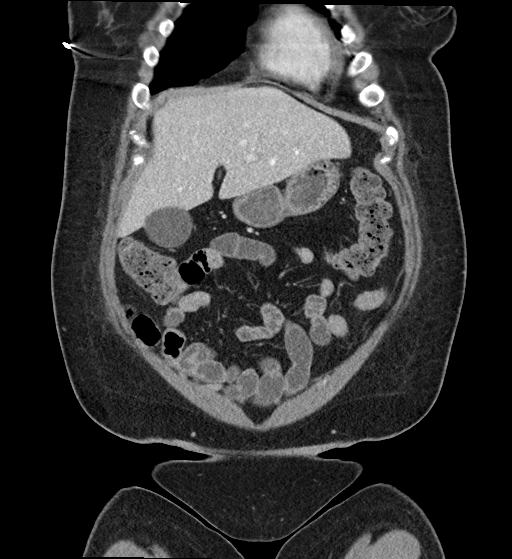
[im 51/115  soft-tissue]
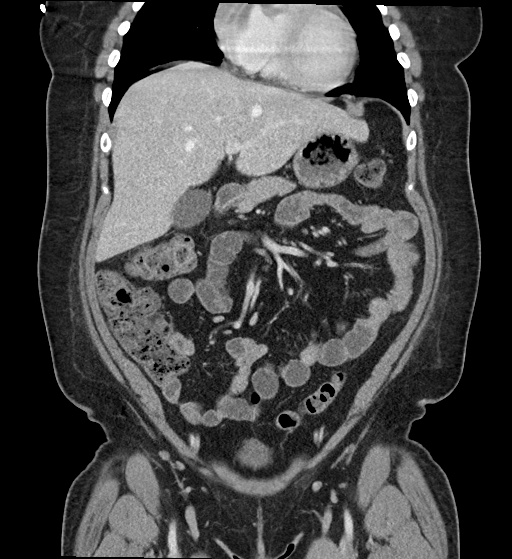
[im 64/115  soft-tissue]
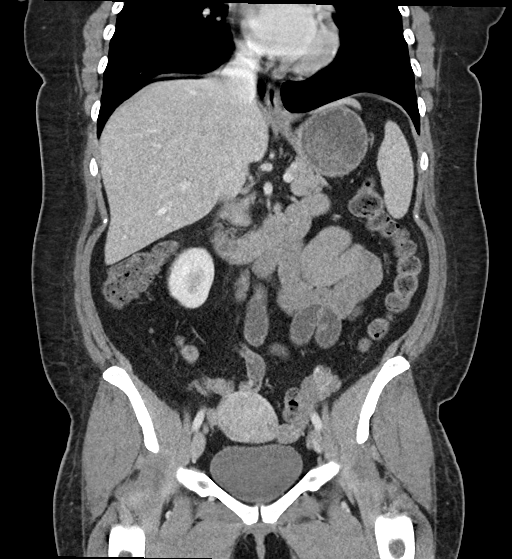

[17 of 46 positions shown; findings below may reference images not displayed]

RADIATION DOSE REDUCTION: This exam was performed according to the
departmental dose-optimization program which includes automated
exposure control, adjustment of the mA and/or kV according to
patient size and/or use of iterative reconstruction technique.

CONTRAST:  100mL OMNIPAQUE IOHEXOL 300 MG/ML  SOLN
FINDINGS: Lower chest: Normal

Hepatobiliary: Liver parenchyma is normal.  No calcified gallstones.

Pancreas: Normal

Spleen: Normal

Adrenals/Urinary Tract: Adrenal glands are normal. Kidneys are
normal. Bladder is normal.

Stomach/Bowel: Stomach and small intestine are normal. Appendix is
normal. No colon abnormality is seen.

Vascular/Lymphatic: Aorta and IVC are normal.  No adenopathy.

Reproductive: Uterus, cervix and vagina appear normal. Both ovaries
appear normal by CT. Adnexal regions appear otherwise normal. There
is no evidence of pelvic hemorrhage. I cannot see evidence of an
ectopic pregnancy based on this study.

Other: No free fluid or air.

Musculoskeletal: Normal other than mild edema associated with the
laparoscopic periumbilical approach.
IMPRESSION: No unexpected CT finding. Mild edema at the periumbilical
laparoscopic approach. No intraabdominal or pelvic abnormal finding
by CT.

## 2023-02-12 IMAGING — US US PELVIS COMPLETE TRANSABD/TRANSVAG W DUPLEX AND/OR DOPPLER
1 series · 13 of 25 positions shown · non-contrast
Comparison: 08/06/2021, 08/08/2021

CLINICAL DATA: Diffuse pelvic pain, recent removal of a left
cornual ectopic pregnancy 08/06/2021

EXAM:
TRANSABDOMINAL AND TRANSVAGINAL ULTRASOUND OF PELVIS
DOPPLER ULTRASOUND OF OVARIES
TECHNIQUE: Both transabdominal and transvaginal ultrasound examinations of the
pelvis were performed. Transabdominal technique was performed for
global imaging of the pelvis including uterus, ovaries, adnexal
regions, and pelvic cul-de-sac.
It was necessary to proceed with endovaginal exam following the
transabdominal exam to visualize the to visualize the adnexae in
better detail. Color and duplex Doppler ultrasound was utilized to
evaluate blood flow to the ovaries.

[Series 1: us pelvic complete w transvaginal and torsion righ · 13 of 55 slices shown]
[im 1/55]
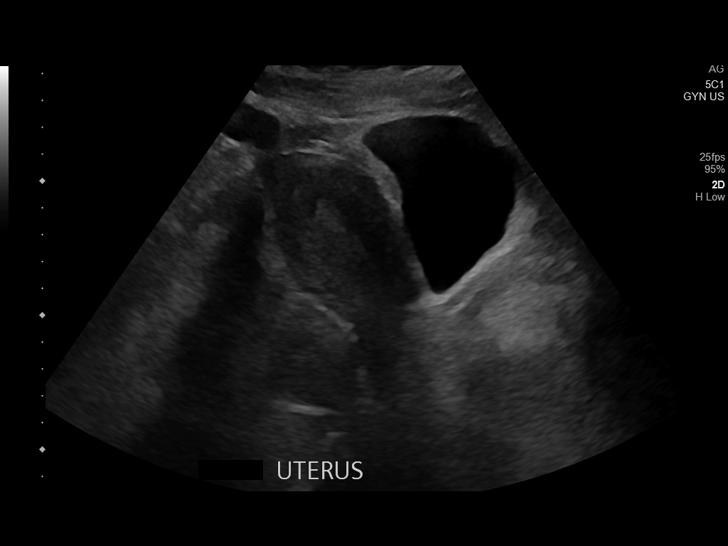
[im 5/55]
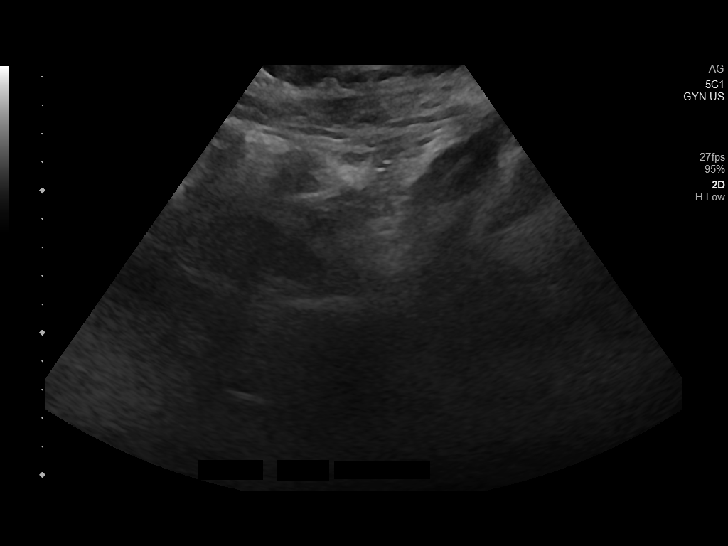
[im 10/55]
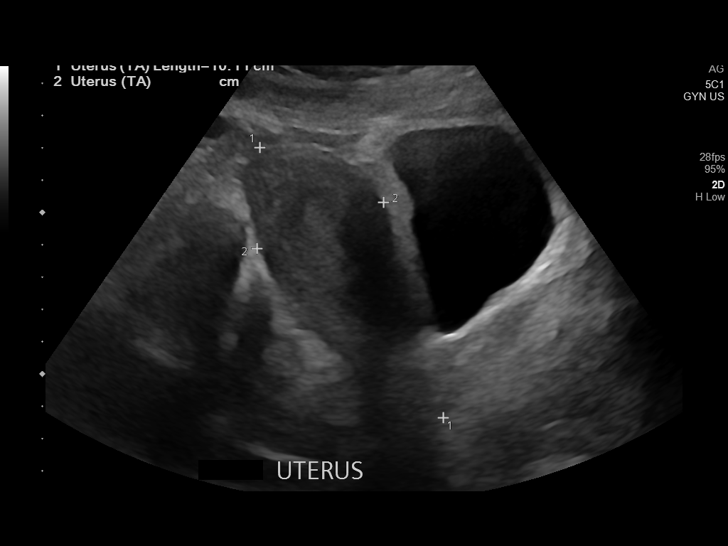
[im 14/55]
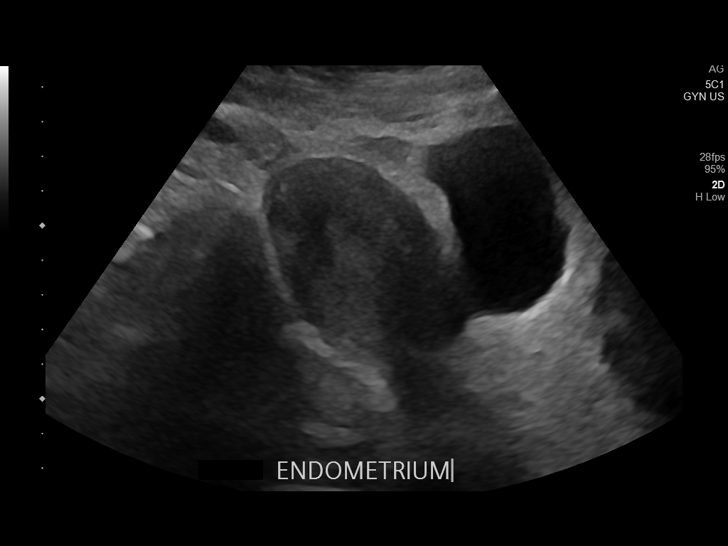
[im 19/55]
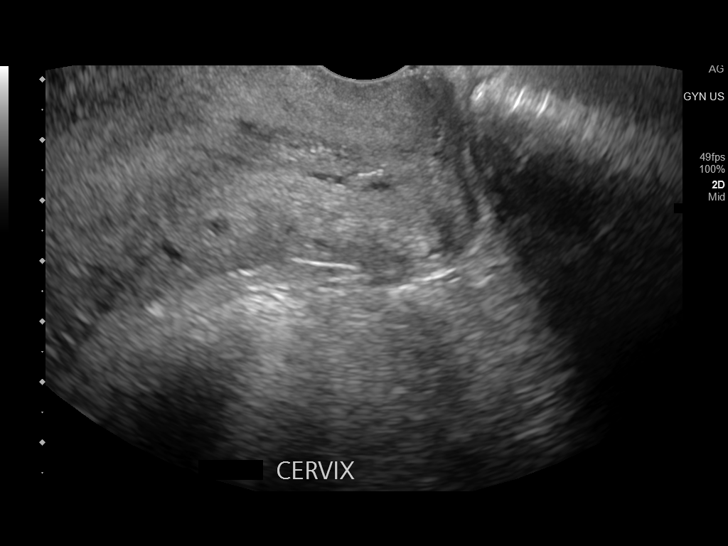
[im 23/55]
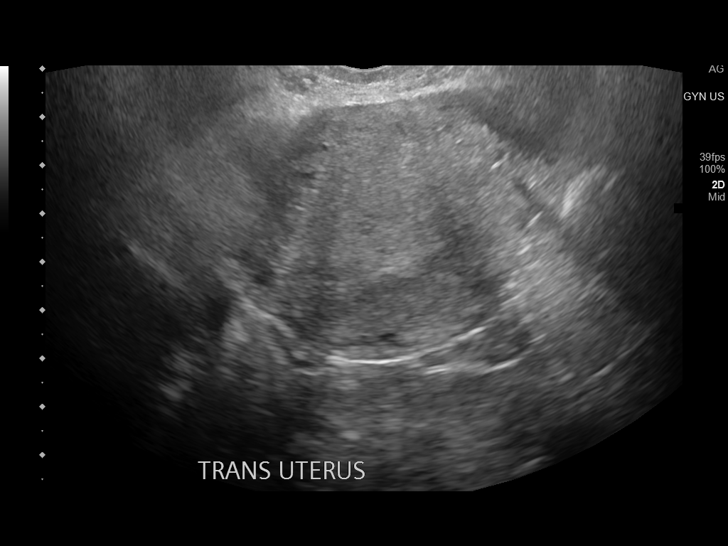
[im 28/55]
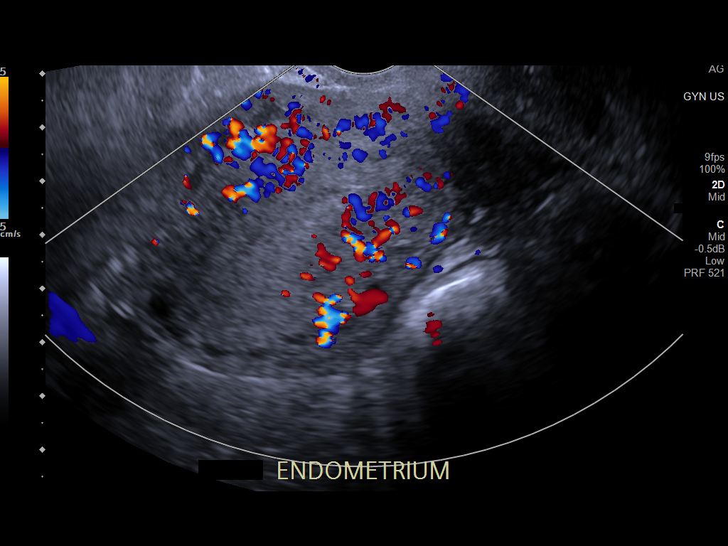
[im 32/55]
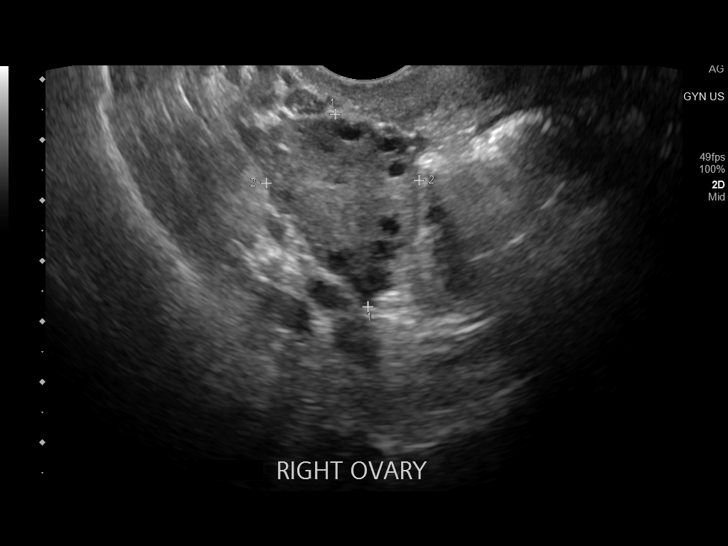
[im 37/55]
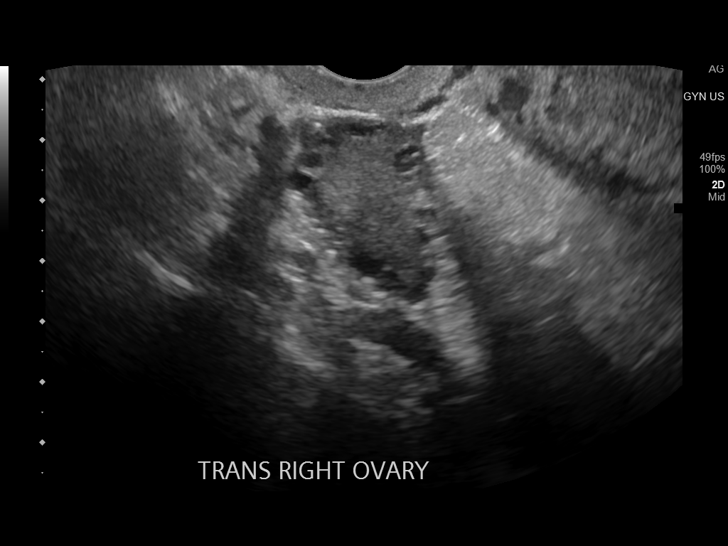
[im 41/55]
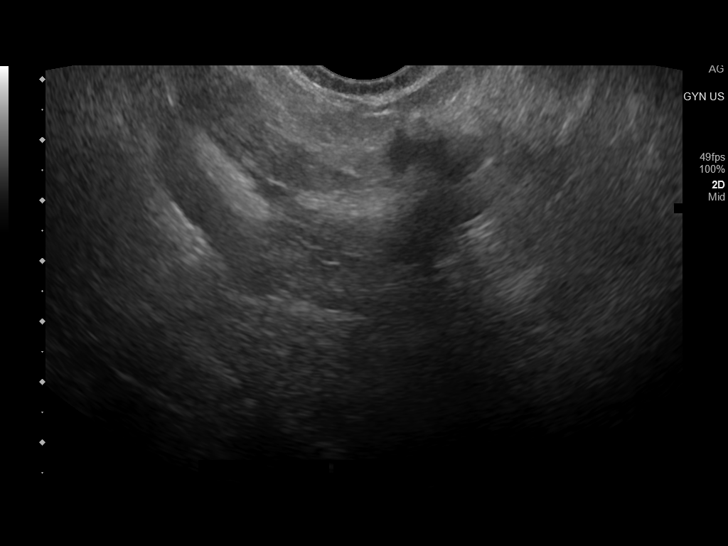
[im 46/55]
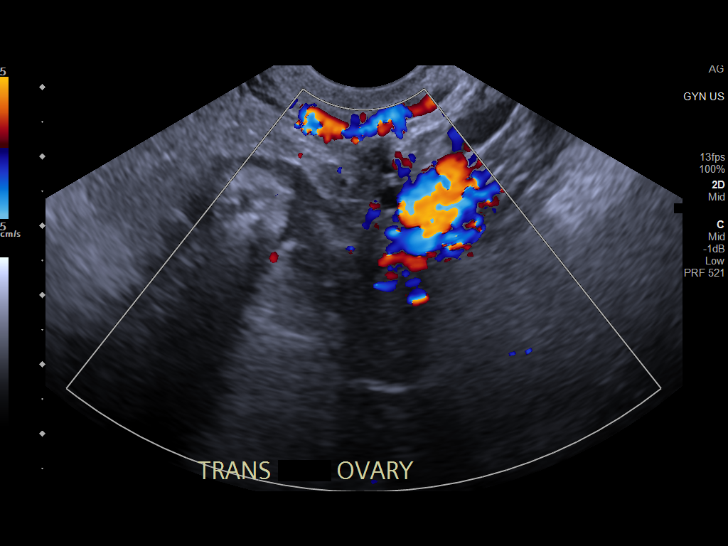
[im 50/55]
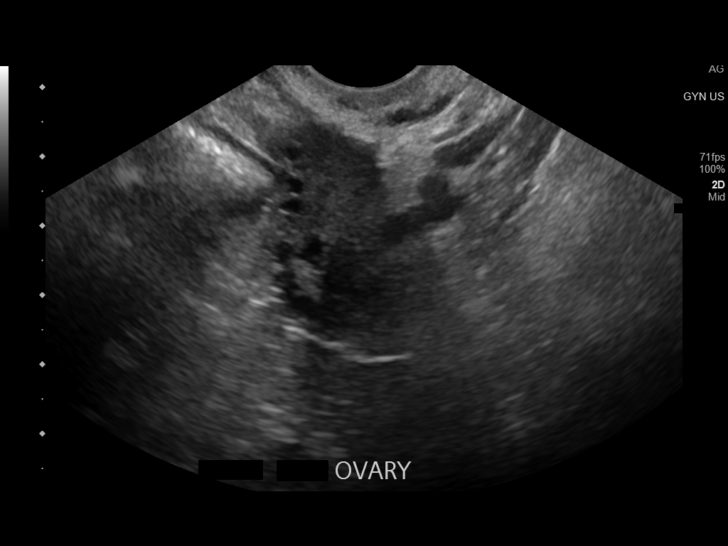
[im 55/55]
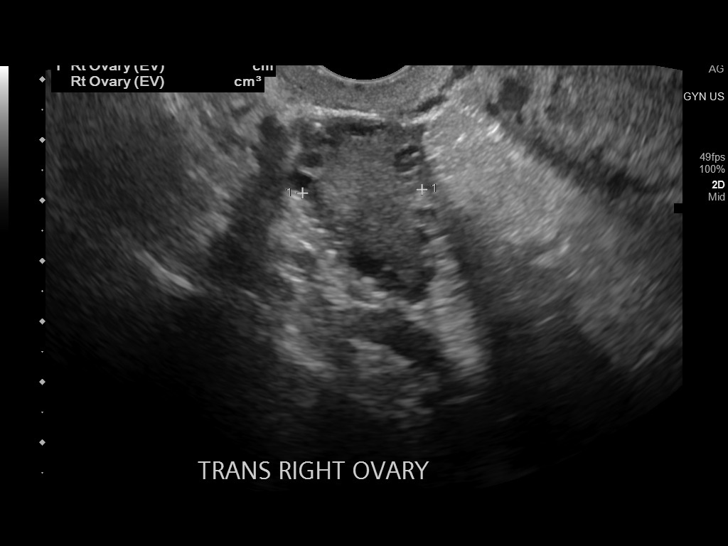

[13 of 25 positions shown; findings below may reference images not displayed]

FINDINGS: Uterus

Measurements: 9.2 x 5.5 x 5.9 cm = volume: 156 mL. No fibroids or
other mass visualized.

Endometrium

Thickness: 8 mm.  Trace endometrial and cervical canal fluid.

Right ovary

Measurements: 3.2 x 2.5 x 2.0 cm = volume: 8.0 mL. Normal
appearance/no adnexal mass.

Left ovary

Measurements: 3.0 x 1.6 x 1.5 cm = volume: 4.0 mL. Normal
appearance/no adnexal mass.

Pulsed Doppler evaluation of both ovaries demonstrates normal
low-resistance arterial and venous waveforms.

Other findings

No abnormal free fluid.
IMPRESSION: No acute finding by pelvic ultrasound. Trace endometrial and
cervical canal fluid, nonspecific.

Normal appearing ovaries and Doppler evaluation.

## 2023-03-06 ENCOUNTER — Encounter: Payer: Self-pay | Admitting: Psychiatry

## 2023-03-06 ENCOUNTER — Ambulatory Visit (INDEPENDENT_AMBULATORY_CARE_PROVIDER_SITE_OTHER): Payer: Medicaid Other | Admitting: Psychiatry

## 2023-03-06 VITALS — BP 127/85 | HR 97 | Temp 97.6°F | Ht 61.0 in | Wt 189.4 lb

## 2023-03-06 DIAGNOSIS — F4310 Post-traumatic stress disorder, unspecified: Secondary | ICD-10-CM | POA: Diagnosis not present

## 2023-03-06 DIAGNOSIS — F39 Unspecified mood [affective] disorder: Secondary | ICD-10-CM | POA: Insufficient documentation

## 2023-03-06 DIAGNOSIS — R41840 Attention and concentration deficit: Secondary | ICD-10-CM | POA: Diagnosis not present

## 2023-03-06 DIAGNOSIS — F411 Generalized anxiety disorder: Secondary | ICD-10-CM | POA: Insufficient documentation

## 2023-03-06 DIAGNOSIS — J309 Allergic rhinitis, unspecified: Secondary | ICD-10-CM | POA: Insufficient documentation

## 2023-03-06 MED ORDER — SERTRALINE HCL 25 MG PO TABS
25.0000 mg | ORAL_TABLET | Freq: Every day | ORAL | 1 refills | Status: DC
Start: 2023-03-06 — End: 2023-04-04
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
## 2023-03-08 ENCOUNTER — Telehealth: Payer: Self-pay | Admitting: Psychiatry

## 2023-03-08 NOTE — Telephone Encounter (Signed)
Referring provider office, [facility], called to see if a note can be faxed to their office in regards to the patients appointment so the referral can be closed.

## 2023-03-08 NOTE — Telephone Encounter (Signed)
Noted and thank you

## 2023-03-08 NOTE — Telephone Encounter (Signed)
Please make sure to verify with patient and let her sign an ROI since its psychiatry , we will need her to sign . She also said her provider changed , so make sure to clarify the name of who she wants it send to .

## 2023-04-04 ENCOUNTER — Encounter: Payer: Self-pay | Admitting: Psychiatry

## 2023-04-04 ENCOUNTER — Telehealth: Payer: Self-pay | Admitting: Psychiatry

## 2023-04-04 ENCOUNTER — Telehealth: Payer: Medicaid Other | Admitting: Psychiatry

## 2023-04-04 DIAGNOSIS — F4310 Post-traumatic stress disorder, unspecified: Secondary | ICD-10-CM | POA: Diagnosis not present

## 2023-04-04 DIAGNOSIS — R41840 Attention and concentration deficit: Secondary | ICD-10-CM

## 2023-04-04 DIAGNOSIS — F39 Unspecified mood [affective] disorder: Secondary | ICD-10-CM

## 2023-04-04 DIAGNOSIS — F411 Generalized anxiety disorder: Secondary | ICD-10-CM

## 2023-04-04 MED ORDER — SERTRALINE HCL 50 MG PO TABS
50.0000 mg | ORAL_TABLET | Freq: Every day | ORAL | 0 refills | Status: DC
Start: 2023-04-04 — End: 2023-06-18

## 2023-04-04 NOTE — Telephone Encounter (Signed)
Patient was referred for ADHD testing-referral was placed-03/06/2023-Dr. Kieth Brightly.  Patient reports she has not heard back, will have staff contact to get an update.

## 2023-04-04 NOTE — Progress Notes (Signed)
Virtual Visit via Video Note  I connected with [name] on 04/04/23 at  3:30 PM EST by a video enabled telemedicine application and verified that I am speaking with the correct person using two identifiers.  Location Provider Location : [facility] Patient Location : Home  Participants: Patient , Provider    I discussed the limitations of evaluation and management by telemedicine and the availability of in person appointments. The patient expressed understanding and agreed to proceed.   I discussed the assessment and treatment plan with the patient. The patient was provided an opportunity to ask questions and all were answered. The patient agreed with the plan and demonstrated an understanding of the instructions.   The patient was advised to call back or seek an in-person evaluation if the symptoms worsen or if the condition fails to improve as anticipated.    BH MD OP Progress Note  04/05/2023 1:41 PM Serria Secoy  MRN:  [id number]  Chief Complaint:  Chief Complaint  Patient presents with   Follow-up   Anxiety   Depression   Medication Refill   HPI: Jasmine Price is a 27 year old Caucasian female, self-employed, lives in [place], married, has a history of GAD, PTSD, episodic mood disorder, PCOS, attention and concentration deficit, allergic rhinitis was evaluated by telemedicine today.  The patient, diagnosed with generalized anxiety disorder, PTSD, and episodic mood symptoms, presents for a follow-up visit after an initial intake evaluation. She reports some improvement in anxiety symptoms since starting on sertraline 25mg . She also notes an increase in energy levels, which she attributes to treatment for a severe vitamin D deficiency.  In addition to the sertraline, the patient is also on Wellbutrin 300mg , which was prescribed by her primary care provider. She reports no side effects from these medications.  Wellbutrin was added initially by her provider for  attention and focus problems and she believes that has helped. The patient has been actively engaged in therapy at the [facility] therapy clinic. However, she has not yet scheduled an appointment for ADHD testing, which was previously discussed. She reports that her psychiatrist had conducted some testing, but it is unclear if this was the comprehensive neuropsychological testing recommended.  She has also recently started on a weight loss medication, Wegovy, and is monitoring her calorie intake and hydration.  Despite these positive changes, the patient still reports some residual anxiety and requests an increase in her sertraline dosage.  She denies any suicidal ideation, homicidal ideation or perceptual disturbances.  Patient denies any substance use, with the exception of a single drink on Thanksgiving.  The patient also recently completed a course of moxifloxacin for a bacterial infection. She is due for a follow-up test to ensure the infection has cleared. She denies any current symptoms related to this infection.  Patient denies any other concerns today.  Visit Diagnosis:    ICD-10-CM   1. GAD (generalized anxiety disorder)  F41.1 sertraline (ZOLOFT) 50 MG tablet    2. PTSD (post-traumatic stress disorder)  F43.10 sertraline (ZOLOFT) 50 MG tablet    3. Episodic mood disorder (HCC)  F39 sertraline (ZOLOFT) 50 MG tablet    4. Attention and concentration deficit  R41.840       Past Psychiatric History: I have reviewed past psychiatric history from progress note on 03/06/2023.  Past Medical History:  Past Medical History:  Diagnosis Date   Anxiety    Depression    HIV disease (HCC)    UNDETECTABLE   Pregnancy, ectopic, cornual or  cervical 08/06/2021   Left wedge resection of cornual ectopic pregnancy    Past Surgical History:  Procedure Laterality Date   TYMPANOSTOMY TUBE PLACEMENT Right    2013 and previously as a child   WISDOM TOOTH EXTRACTION  2021   three;   XI  ROBOTIC ASSISTED SALPINGECTOMY Left 08/06/2021   Procedure: XI ROBOTIC ASSISTED  Removal of Cornual Pregnancy;  Surgeon: Natale Milch, MD;  Location: [facility] ORS;  Service: Gynecology;  Laterality: Left;    Family Psychiatric History: I have reviewed family psychiatric history from progress note on 03/06/2023.  Family History:  Family History  Problem Relation Age of Onset   Hypertension Mother    Eclampsia Mother        preeclampsia   Schizophrenia Father    Gout Father    Bipolar disorder Father    Healthy Brother    Healthy Brother    Cancer Maternal Grandmother 30       lung   Diabetes Maternal Grandmother    Diabetes Maternal Grandfather    COPD Maternal Grandfather    Cystic fibrosis Son        carrier   ADD / ADHD Son    Healthy Half-Brother    Healthy Half-Sister    Healthy Half-Sister    Healthy Half-Sister     Social History: I have reviewed social history from progress note on 03/06/2023. Social History   Socioeconomic History   Marital status: Married    Spouse name: Dorinda Hill   Number of children: 2   Years of education: 14   Highest education level: Not on file  Occupational History   Occupation: Research scientist (medical)  Tobacco Use   Smoking status: Former    Types: Cigarettes   Smokeless tobacco: Never  Vaping Use   Vaping status: Every Day   Devices: for a month then stopped  Substance and Sexual Activity   Alcohol use: No    Comment: rarely   Drug use: Never   Sexual activity: Yes    Partners: Male    Birth control/protection: None  Other Topics Concern   Not on file  Social History Narrative   Not on file   Social Determinants of Health   Financial Resource Strain: Medium Risk (08/01/2022)   Overall Financial Resource Strain (CARDIA)    Difficulty of Paying Living Expenses: Somewhat hard  Food Insecurity: No Food Insecurity (08/01/2022)   Hunger Vital Sign    Worried About Running Out of Food in the Last Year: Never true    Ran Out of Food in  the Last Year: Never true  Transportation Needs: No Transportation Needs (08/01/2022)   PRAPARE - Administrator, Civil Service (Medical): No    Lack of Transportation (Non-Medical): No  Physical Activity: Sufficiently Active (08/01/2022)   Exercise Vital Sign    Days of Exercise per Week: 7 days    Minutes of Exercise per Session: 150+ min  Stress: Stress Concern Present (08/01/2022)   [employer] of Occupational Health - Occupational Stress Questionnaire    Feeling of Stress : To some extent  Social Connections: Moderately Integrated (08/01/2022)   Social Connection and Isolation Panel [NHANES]    Frequency of Communication with Friends and Family: Twice a week    Frequency of Social Gatherings with Friends and Family: Once a week    Attends Religious Services: Never    Database administrator or Organizations: Yes    Attends Engineer, structural: More than  4 times per year    Marital Status: Married    Allergies: No Known Allergies  Metabolic Disorder Labs: No results found for: "HGBA1C", "MPG" No results found for: "PROLACTIN" No results found for: "CHOL", "TRIG", "HDL", "CHOLHDL", "VLDL", "LDLCALC" Lab Results  Component Value Date   TSH 1.540 04/16/2022    Therapeutic Level Labs: No results found for: "LITHIUM" No results found for: "VALPROATE" No results found for: "CBMZ"  Current Medications: Current Outpatient Medications  Medication Sig Dispense Refill   atorvastatin (LIPITOR) 10 MG tablet      BIKTARVY 50-200-25 MG TABS tablet Take 1 tablet by mouth daily.     buPROPion (WELLBUTRIN XL) 300 MG 24 hr tablet Take 300 mg by mouth daily.     cetirizine (ZYRTEC) 10 MG tablet Take by mouth daily.     cholecalciferol (VITAMIN D3) 25 MCG (1000 UNIT) tablet Take 1,000 Units by mouth daily.     fluticasone (FLONASE) 50 MCG/ACT nasal spray as needed.     hydrocortisone (ANUSOL-HC) 2.5 % rectal cream Apply 1 Application topically as needed.      levocetirizine (XYZAL) 5 MG tablet once.     metFORMIN (GLUCOPHAGE) 500 MG tablet Take by mouth once.     mometasone (ELOCON) 0.1 % cream Apply 1 Application topically 2 (two) times daily.     Prenatal Vit-Fe Fumarate-FA (MULTIVITAMIN-PRENATAL) 27-0.8 MG TABS tablet Take 1 tablet by mouth at bedtime.     PREVIDENT 5000 ENAMEL PROTECT 1.1-5 % GEL Apply a small amount to teeth twice a day     sertraline (ZOLOFT) 50 MG tablet Take 1 tablet (50 mg total) by mouth daily with breakfast. 90 tablet 0   triamcinolone ointment (KENALOG) 0.1 % Apply topically as needed.     VENTOLIN HFA 108 (90 Base) MCG/ACT inhaler Inhale 1-2 puffs into the lungs every 6 (six) hours as needed.     Vitamin D, Ergocalciferol, (DRISDOL) 1.25 MG (50000 UNIT) CAPS capsule SMARTSIG:1.0 Capsule(s) By Mouth Once a Week     WEGOVY 0.25 MG/0.5ML SOAJ      WEGOVY 0.5 MG/0.5ML SOAJ Inject into the skin.     WEGOVY 1 MG/0.5ML SOAJ Inject into the skin.     No current facility-administered medications for this visit.     Musculoskeletal: Strength & Muscle Tone:  UTA Gait & Station:  Seated Patient leans: N/A  Psychiatric Specialty Exam: Review of Systems  Psychiatric/Behavioral:  Positive for decreased concentration. The patient is nervous/anxious.     Last menstrual period 02/25/2023.There is no height or weight on file to calculate BMI.  General Appearance: Fairly Groomed  Eye Contact:  Fair  Speech:  Clear and Coherent  Volume:  Normal  Mood:  Anxious  Affect:  Congruent  Thought Process:  Goal Directed and Descriptions of Associations: Intact  Orientation:  Full (Time, Place, and Person)  Thought Content: Logical   Suicidal Thoughts:  No  Homicidal Thoughts:  No  Memory:  Immediate;   Fair Recent;   Fair Remote;   Fair  Judgement:  Intact  Insight:  Fair  Psychomotor Activity:  Normal  Concentration:  Concentration: Fair and Attention Span: Fair  Recall:  [employer] of Knowledge: Fair  Language: Fair   Akathisia:  No  Handed:  Right  AIMS (if indicated): not done  Assets:  Communication Skills Desire for Improvement Housing Social Support  ADL's:  Intact  Cognition: WNL  Sleep:   Improving   Screenings: GAD-7    Flowsheet Row  [facility] Visit from 03/06/2023 in [facility]  Total GAD-7 Score 11      PHQ2-9    Flowsheet Row Office Visit from 03/06/2023 in [facility]  PHQ-2 Total Score 1  PHQ-9 Total Score 9      Flowsheet Row Video Visit from 04/04/2023 in [facility] Office Visit from 03/06/2023 in [facility] ED from 02/07/2022 in [facility] Department at [facility]  C-SSRS RISK CATEGORY No Risk No Risk No Risk        Assessment and Plan: Jasmine Price is a 27 year old Caucasian female who has a history of anxiety, PTSD, episodic mood disorder, PCOS, allergic rhinitis, presents for a follow-up appointment with positive response to the sertraline and Wellbutrin combination, will benefit from further medication changes as noted below and continued psychotherapy sessions.   Generalized Anxiety Disorder-improving Partial response to sertraline 25 mg with improvement but persistent anxiety. No side effects reported. Discussed increasing sertraline to 50 mg for further symptom relief, noting full effect typically takes eight weeks. - Increase sertraline to 50 mg daily - Send prescription to [facility] specialty pharmacy - Follow up in 7 weeks  Post-Traumatic Stress Disorder (PTSD)-improving PTSD with episodic mood symptoms. Wellbutrin 300 mg prescribed by primary care provider is significantly helping. -  Increase sertraline as noted. -  Continue CBT with therapist at [facility].  Episodic mood disorder-improving - Continue Wellbutrin 300 mg daily - Will consider addition of a mood stabilizer  in the future. - Continue sertraline as noted above.  Dosage increase.  Attention-Deficit/Hyperactivity Disorder (ADHD) - Rule Out Neuropsychological testing for ADHD not yet completed. Previous testing by psychiatrist was not comprehensive. Referral made on 11/6 but no follow-up from testing center. Discussed need for comprehensive testing by a neuropsychologist. - Send message to nurse to check status of ADHD referral  Severe vitamin D deficiency treated with high-dose supplementation. Reports improved energy levels. - Continue current vitamin D supplementation as prescribed  Weight Management Started on Wegovy for weight loss with planned titration. Discussed monitoring weight and dietary intake. - Continue [id number] as prescribed - Monitor weight and dietary intake  Follow-up - Schedule video visit on January 23rd at 4 PM.  Collaboration of Care: Collaboration of Care: Referral or follow-up with counselor/therapist AEB patient to continue CBT.  Patient/Guardian was advised Release of Information must be obtained prior to any record release in order to collaborate their care with an outside provider. Patient/Guardian was advised if they have not already done so to contact the registration department to sign all necessary forms in order for [place] to release information regarding their care.   Consent: Patient/Guardian gives verbal consent for treatment and assignment of benefits for services provided during this visit. Patient/Guardian expressed understanding and agreed to proceed.   This note was generated in part or whole with voice recognition software. Voice recognition is usually quite accurate but there are transcription errors that can and very often do occur. I apologize for any typographical errors that were not detected and corrected.    Jomarie Longs, MD 04/05/2023, 1:41 PM

## 2023-04-08 NOTE — Telephone Encounter (Signed)
the will call the patient. it will be in september.

## 2023-04-08 NOTE — Telephone Encounter (Signed)
noted 

## 2023-04-23 ENCOUNTER — Telehealth: Payer: Self-pay | Admitting: Psychiatry

## 2023-04-23 NOTE — Telephone Encounter (Signed)
Patient called about follow up appointment, was not sure when she needed to be seen again. If Patient has been scheduled for 05/30/2023.

## 2023-04-28 NOTE — Telephone Encounter (Signed)
I am not sure what the question is ? Yes she is recommended to keep her appointment.

## 2023-05-30 ENCOUNTER — Telehealth: Payer: Medicaid Other | Admitting: Psychiatry

## 2023-05-30 ENCOUNTER — Encounter: Payer: Self-pay | Admitting: Psychology

## 2023-05-30 ENCOUNTER — Encounter: Payer: Self-pay | Admitting: Psychiatry

## 2023-05-30 DIAGNOSIS — F4310 Post-traumatic stress disorder, unspecified: Secondary | ICD-10-CM

## 2023-05-30 DIAGNOSIS — R41840 Attention and concentration deficit: Secondary | ICD-10-CM

## 2023-05-30 DIAGNOSIS — F39 Unspecified mood [affective] disorder: Secondary | ICD-10-CM

## 2023-05-30 DIAGNOSIS — F411 Generalized anxiety disorder: Secondary | ICD-10-CM | POA: Diagnosis not present

## 2023-05-30 NOTE — Progress Notes (Signed)
Virtual Visit via Video Note  I connected with Jasmine Price on 05/30/23 at 10:30 AM EST by a video enabled telemedicine application and verified that I am speaking with the correct person using two identifiers.  Location Provider Location : [facility] Patient Location : Home  Participants: Patient , Provider    I discussed the limitations of evaluation and management by telemedicine and the availability of in person appointments. The patient expressed understanding and agreed to proceed. I discussed the assessment and treatment plan with the patient. The patient was provided an opportunity to ask questions and all were answered. The patient agreed with the plan and demonstrated an understanding of the instructions.   The patient was advised to call back or seek an in-person evaluation if the symptoms worsen or if the condition fails to improve as anticipated.    BH MD OP Progress Note  05/30/2023 10:25 PM Jasmine Price  MRN:  [id number]  Chief Complaint:  Chief Complaint  Patient presents with   Follow-up   Depression   Anxiety   Medication Refill   HPI: Jasmine Price is a 28 year old Caucasian female, self-employed, lives in [place], married, has a history of GAD, PTSD, episodic mood disorder, PCOS, attention and concentration deficit, allergic rhinitis was evaluated by telemedicine today.  The patient with depression, generalized anxiety disorder, PTSD, and episodic mood symptoms who presents for follow-up.   She has a history of depression and reports that the increase in Zoloft to 50 mg has been beneficial, with no significant depressive symptoms recently. She continues to take Wellbutrin 300 mg, which she finds helpful, and is actively engaged in therapy at [facility].  She has generalized anxiety disorder and PTSD. The increased dose of Zoloft has also been effective in managing her anxiety and PTSD symptoms. She is awaiting ADHD testing and has not yet received  information about scheduling an appointment. No thoughts of self-harm or harm to others.   Patient reports she is currently maintaining her diet.  She reports a weight loss of 17 to 18 pounds with Wegovy, which she is now on a maintenance dose.  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  Visit Diagnosis:    ICD-10-CM   1. GAD (generalized anxiety disorder)  F41.1     2. PTSD (post-traumatic stress disorder)  F43.10     3. Episodic mood disorder (HCC)  F39     4. Attention and concentration deficit  R41.840       Past Psychiatric History: I have reviewed past psychiatric history from progress note on 03/06/2023.  Past Medical History:  Past Medical History:  Diagnosis Date   Anxiety    Depression    HIV disease (HCC)    UNDETECTABLE   Pregnancy, ectopic, cornual or cervical 08/06/2021   Left wedge resection of cornual ectopic pregnancy    Past Surgical History:  Procedure Laterality Date   TYMPANOSTOMY TUBE PLACEMENT Right    2013 and previously as a child   WISDOM TOOTH EXTRACTION  2021   three;   XI ROBOTIC ASSISTED SALPINGECTOMY Left 08/06/2021   Procedure: XI ROBOTIC ASSISTED  Removal of Cornual Pregnancy;  Surgeon: Natale Milch, MD;  Location: [facility] ORS;  Service: Gynecology;  Laterality: Left;    Family Psychiatric History: I have reviewed family psychiatric history from progress note on 03/06/2023.  Family History:  Family History  Problem Relation Age of Onset   Hypertension Mother    Eclampsia Mother  preeclampsia   Schizophrenia Father    Gout Father    Bipolar disorder Father    Healthy Brother    Healthy Brother    Cancer Maternal Grandmother 82       lung   Diabetes Maternal Grandmother    Diabetes Maternal Grandfather    COPD Maternal Grandfather    Cystic fibrosis Son        carrier   ADD / ADHD Son    Healthy Half-Brother    Healthy Half-Sister    Healthy Half-Sister    Healthy Half-Sister     Social History:  I have reviewed social history from progress note on 03/06/2023. Social History   Socioeconomic History   Marital status: Married    Spouse name: Dorinda Hill   Number of children: 2   Years of education: 14   Highest education level: Not on file  Occupational History   Occupation: Research scientist (medical)  Tobacco Use   Smoking status: Former    Types: Cigarettes   Smokeless tobacco: Never  Vaping Use   Vaping status: Every Day   Devices: for a month then stopped  Substance and Sexual Activity   Alcohol use: No    Comment: rarely   Drug use: Never   Sexual activity: Yes    Partners: Male    Birth control/protection: None  Other Topics Concern   Not on file  Social History Narrative   Not on file   Social Drivers of Health   Financial Resource Strain: Medium Risk (08/01/2022)   Overall Financial Resource Strain (CARDIA)    Difficulty of Paying Living Expenses: Somewhat hard  Food Insecurity: No Food Insecurity (08/01/2022)   Hunger Vital Sign    Worried About Running Out of Food in the Last Year: Never true    Ran Out of Food in the Last Year: Never true  Transportation Needs: No Transportation Needs (08/01/2022)   PRAPARE - Administrator, Civil Service (Medical): No    Lack of Transportation (Non-Medical): No  Physical Activity: Sufficiently Active (08/01/2022)   Exercise Vital Sign    Days of Exercise per Week: 7 days    Minutes of Exercise per Session: 150+ min  Stress: Stress Concern Present (08/01/2022)   [employer] of Occupational Health - Occupational Stress Questionnaire    Feeling of Stress : To some extent  Social Connections: Moderately Integrated (08/01/2022)   Social Connection and Isolation Panel [NHANES]    Frequency of Communication with Friends and Family: Twice a week    Frequency of Social Gatherings with Friends and Family: Once a week    Attends Religious Services: Never    Database administrator or Organizations: Yes    Attends Hospital doctor: More than 4 times per year    Marital Status: Married    Allergies: No Known Allergies  Metabolic Disorder Labs: No results found for: "HGBA1C", "MPG" No results found for: "PROLACTIN" No results found for: "CHOL", "TRIG", "HDL", "CHOLHDL", "VLDL", "LDLCALC" Lab Results  Component Value Date   TSH 1.540 04/16/2022    Therapeutic Level Labs: No results found for: "LITHIUM" No results found for: "VALPROATE" No results found for: "CBMZ"  Current Medications: Current Outpatient Medications  Medication Sig Dispense Refill   BIKTARVY 50-200-25 MG TABS tablet Take 1 tablet by mouth daily.     buPROPion (WELLBUTRIN XL) 300 MG 24 hr tablet Take 300 mg by mouth daily.     cetirizine (ZYRTEC) 10 MG tablet Take  by mouth daily.     cholecalciferol (VITAMIN D3) 25 MCG (1000 UNIT) tablet Take 1,000 Units by mouth daily.     fluticasone (FLONASE) 50 MCG/ACT nasal spray as needed.     hydrocortisone (ANUSOL-HC) 2.5 % rectal cream Apply 1 Application topically as needed.     levocetirizine (XYZAL) 5 MG tablet once.     mometasone (ELOCON) 0.1 % cream Apply 1 Application topically 2 (two) times daily.     Prenatal Vit-Fe Fumarate-FA (MULTIVITAMIN-PRENATAL) 27-0.8 MG TABS tablet Take 1 tablet by mouth at bedtime.     PREVIDENT 5000 ENAMEL PROTECT 1.1-5 % GEL Apply a small amount to teeth twice a day     sertraline (ZOLOFT) 50 MG tablet Take 1 tablet (50 mg total) by mouth daily with breakfast. 90 tablet 0   triamcinolone ointment (KENALOG) 0.1 % Apply topically as needed.     VENTOLIN HFA 108 (90 Base) MCG/ACT inhaler Inhale 1-2 puffs into the lungs every 6 (six) hours as needed.     Vitamin D, Ergocalciferol, (DRISDOL) 1.25 MG (50000 UNIT) CAPS capsule SMARTSIG:1.0 Capsule(s) By Mouth Once a Week     WEGOVY 0.25 MG/0.5ML SOAJ      WEGOVY 0.5 MG/0.5ML SOAJ Inject into the skin.     WEGOVY 1 MG/0.5ML SOAJ Inject into the skin.     No current facility-administered medications for this  visit.     Musculoskeletal: Strength & Muscle Tone:  UTA Gait & Station:  Seated Patient leans: N/A  Psychiatric Specialty Exam: Review of Systems  Psychiatric/Behavioral:  Positive for decreased concentration.     There were no vitals taken for this visit.There is no height or weight on file to calculate BMI.  General Appearance: Fairly Groomed  Eye Contact:  Fair  Speech:  Clear and Coherent  Volume:  Normal  Mood:  Euthymic  Affect:  Appropriate  Thought Process:  Goal Directed and Descriptions of Associations: Intact  Orientation:  Full (Time, Place, and Person)  Thought Content: Logical   Suicidal Thoughts:  No  Homicidal Thoughts:  No  Memory:  Immediate;   Fair Recent;   Fair Remote;   Fair  Judgement:  Fair  Insight:  Fair  Psychomotor Activity:  Normal  Concentration:  Concentration: Fair and Attention Span: Fair  Recall:  [employer] of Knowledge: Fair  Language: Fair  Akathisia:  No  Handed:  Right  AIMS (if indicated): not done  Assets:  Communication Skills Desire for Improvement Resilience Social Support  ADL's:  Intact  Cognition: WNL  Sleep:  Fair   Screenings: GAD-7    Flowsheet Row Office Visit from 03/06/2023 in [facility]  Total GAD-7 Score 11      PHQ2-9    Flowsheet Row Office Visit from 03/06/2023 in [facility]  PHQ-2 Total Score 1  PHQ-9 Total Score 9      Flowsheet Row Video Visit from 05/30/2023 in [facility] Video Visit from 04/04/2023 in [facility] Office Visit from 03/06/2023 in [facility]  C-SSRS RISK CATEGORY No Risk No Risk No Risk        Assessment and Plan: Jasmine Price is a 28 year old Caucasian female who has a history of anxiety, PTSD, episodic mood disorder, PCOS, allergic rhinitis, presents for follow-up  appointment by telemedicine.  Patient is currently improving, discussed assessment and plan as noted below.  Generalized Anxiety Disorder (  GAD)-stable GAD well-managed with Zoloft (sertraline) 50 mg. No current anxiety symptoms reported. Patient prefers to continue current regimen. - Continue Zoloft 50 mg daily - Continue therapy at [facility]  Post-Traumatic Stress Disorder (PTSD)-improving PTSD well-managed with current medication regimen, including Zoloft. No current PTSD symptoms reported. Patient prefers to continue current regimen. - Continue Zoloft 50 mg daily - Continue therapy at [facility]  Episodic mood disorder-improving MDD well-managed with Wellbutrin (bupropion) 300 mg. No current depressive symptoms reported. Patient prefers to continue current regimen. - Continue Wellbutrin 300 mg daily - Continue Zoloft 50 mg daily  Rule out Attention-Deficit/Hyperactivity Disorder (ADHD) Attention and concentration issues. Referral for ADHD testing made but not yet scheduled. Advised to contact Dr. Marvetta Gibbons office to schedule testing. Patient prefers to follow up with psychiatrist if issues arise. - Contact Dr. Marvetta Gibbons office to schedule ADHD testing - Follow up with psychiatrist if there are issues scheduling the appointment  Follow-up - Follow up in 3 months - Next appointment on August 26, 2023, at 4:20 PM by video - Send MyChart message to psychiatrist once ADHD testing appointment is scheduled.   Collaboration of Care: Collaboration of Care: Referral or follow-up with counselor/therapist AEB patient encouraged to continue CBT  Patient/Guardian was advised Release of Information must be obtained prior to any record release in order to collaborate their care with an outside provider. Patient/Guardian was advised if they have not already done so to contact the registration department to sign all necessary forms in order for [place] to release information regarding their care.    Consent: Patient/Guardian gives verbal consent for treatment and assignment of benefits for services provided during this visit. Patient/Guardian expressed understanding and agreed to proceed.  This note was generated in part or whole with voice recognition software. Voice recognition is usually quite accurate but there are transcription errors that can and very often do occur. I apologize for any typographical errors that were not detected and corrected.     Jomarie Longs, MD 05/30/2023, 10:25 PM

## 2023-05-31 ENCOUNTER — Emergency Department: Payer: Medicaid Other

## 2023-05-31 ENCOUNTER — Emergency Department
Admission: EM | Admit: 2023-05-31 | Discharge: 2023-06-01 | Disposition: A | Discharge status: Home / Self Care | Payer: Medicaid Other | Attending: Emergency Medicine | Admitting: Emergency Medicine

## 2023-05-31 ENCOUNTER — Other Ambulatory Visit: Payer: Self-pay

## 2023-05-31 DIAGNOSIS — R079 Chest pain, unspecified: Secondary | ICD-10-CM | POA: Diagnosis present

## 2023-05-31 DIAGNOSIS — I2693 Single subsegmental pulmonary embolism without acute cor pulmonale: Secondary | ICD-10-CM

## 2023-05-31 DIAGNOSIS — N939 Abnormal uterine and vaginal bleeding, unspecified: Secondary | ICD-10-CM | POA: Insufficient documentation

## 2023-05-31 LAB — COMPREHENSIVE METABOLIC PANEL
ALT: 24 U/L (ref 0–44)
AST: 16 U/L (ref 15–41)
Albumin: 4.9 g/dL (ref 3.5–5.0)
Alkaline Phosphatase: 60 U/L (ref 38–126)
Anion gap: 13 (ref 5–15)
BUN: 12 mg/dL (ref 6–20)
CO2: 23 mmol/L (ref 22–32)
Calcium: 9.4 mg/dL (ref 8.9–10.3)
Chloride: 102 mmol/L (ref 98–111)
Creatinine, Ser: 0.74 mg/dL (ref 0.44–1.00)
GFR, Estimated: >60 mL/min (ref >60)
Glucose, Bld: 93 mg/dL (ref 70–99)
Potassium: 3.9 mmol/L (ref 3.5–5.1)
Sodium: 138 mmol/L (ref 135–145)
Total Bilirubin: 0.7 mg/dL (ref 0.0–1.2)
Total Protein: 7.8 g/dL (ref 6.5–8.1)

## 2023-05-31 LAB — CBC
HCT: 43.3 % (ref 36.0–46.0)
Hemoglobin: 14.8 g/dL (ref 12.0–15.0)
MCH: 30 pg (ref 26.0–34.0)
MCHC: 34.2 g/dL (ref 30.0–36.0)
MCV: 87.8 fL (ref 80.0–100.0)
Platelets: 316 10*3/uL (ref 150–400)
RBC: 4.93 MIL/uL (ref 3.87–5.11)
RDW: 12.1 % (ref 11.5–15.5)
WBC: 7.9 10*3/uL (ref 4.0–10.5)
nRBC: 0 % (ref 0.0–0.2)

## 2023-05-31 LAB — LIPASE, BLOOD: Lipase: 33 U/L (ref 11–51)

## 2023-05-31 LAB — URINALYSIS, ROUTINE W REFLEX MICROSCOPIC
Bilirubin Urine: NEGATIVE
Glucose, UA: NEGATIVE mg/dL
Ketones, ur: NEGATIVE mg/dL
Leukocytes,Ua: NEGATIVE
Nitrite: NEGATIVE
Protein, ur: 100 mg/dL — AB
RBC / HPF: >50 RBC/hpf (ref 0–5)
Specific Gravity, Urine: 1.023 (ref 1.005–1.030)
pH: 7 (ref 5.0–8.0)

## 2023-05-31 LAB — BRAIN NATRIURETIC PEPTIDE: B Natriuretic Peptide: 11.7 pg/mL (ref 0.0–100.0)

## 2023-05-31 LAB — POC URINE PREG, ED: Preg Test, Ur: NEGATIVE

## 2023-05-31 LAB — TROPONIN I (HIGH SENSITIVITY): Troponin I (High Sensitivity): 3 ng/L (ref <18)

## 2023-05-31 LAB — D-DIMER, QUANTITATIVE: D-Dimer, Quant: 1.99 ug{FEU}/mL — ABNORMAL HIGH (ref 0.00–0.50)

## 2023-05-31 MED ORDER — IOHEXOL 350 MG/ML SOLN
75.0000 mL | Freq: Once | INTRAVENOUS | Status: AC | PRN
  Administered 2023-05-31: 75 mL via INTRAVENOUS

## 2023-05-31 MED ORDER — KETOROLAC TROMETHAMINE 15 MG/ML IJ SOLN
15.0000 mg | Freq: Once | INTRAMUSCULAR | Status: AC
  Administered 2023-05-31: 15 mg via INTRAVENOUS
  Filled 2023-05-31: qty 1

## 2023-05-31 MED ORDER — MORPHINE SULFATE (PF) 4 MG/ML IV SOLN
4.0000 mg | Freq: Once | INTRAVENOUS | Status: AC
  Administered 2023-06-01: 4 mg via INTRAVENOUS
  Filled 2023-05-31: qty 1

## 2023-05-31 MED ORDER — SODIUM CHLORIDE 0.9 % IV BOLUS
500.0000 mL | Freq: Once | INTRAVENOUS | Status: AC
  Administered 2023-05-31: 500 mL via INTRAVENOUS

## 2023-05-31 NOTE — ED Provider Notes (Signed)
[facility] Provider Note    Event Date/Time   First MD Initiated Contact with Patient 05/31/23 2212     (approximate)   History   Vaginal Bleeding   HPI  Jasmine Price is a 28 y.o. female no significant past medical history presents to the emergency department for not feeling well.  Patient states that she is having left upper abdominal pain/left chest pain over the past 2 days.  States that it is a sharp stabbing pain that is worse whenever she takes a deep breath, coughs or sneezes.  States that it hurts worse to lay on that left side.  Denies any falls or trauma.  Does state that she has had a heavy menstrual cycle.  Denies any dysuria, urinary urgency or frequency.  States that she had a COVID and influenza testing done yesterday that was negative.  Denies any abdominal pain in her lower abdomen.  Does have a history of ectopic pregnancy.  Denies any history of DVT or PE.     Physical Exam   Triage Vital Signs: ED Triage Vitals  Encounter Vitals Group     BP 05/31/23 1739 103/63     Systolic BP Percentile --      Diastolic BP Percentile --      Pulse Rate 05/31/23 1739 (!) 115     Resp 05/31/23 1739 16     Temp 05/31/23 1739 98 F (36.7 C)     Temp Source 05/31/23 1739 Oral     SpO2 05/31/23 1739 98 %     Weight --      Height 05/31/23 1736 5\' 1"  (1.549 m)     Head Circumference --      Peak Flow --      Pain Score 05/31/23 1736 10     Pain Loc --      Pain Education --      Exclude from Growth Chart --     Most recent vital signs: Vitals:   05/31/23 1739 05/31/23 2233  BP: 103/63 108/67  Pulse: (!) 115 91  Resp: 16 16  Temp: 98 F (36.7 C) 98.1 F (36.7 C)  SpO2: 98% 100%    Physical Exam Constitutional:      Appearance: She is well-developed.  HENT:     Head: Atraumatic.  Eyes:     Conjunctiva/sclera: Conjunctivae normal.  Cardiovascular:     Rate and Rhythm: Regular rhythm. Tachycardia present.     Heart sounds: No  murmur heard. Pulmonary:     Effort: No respiratory distress.  Abdominal:     General: There is no distension.     Tenderness: There is no abdominal tenderness. There is no right CVA tenderness or left CVA tenderness.  Musculoskeletal:        General: Normal range of motion.     Cervical back: Normal range of motion.     Right lower leg: No edema.     Left lower leg: No edema.  Skin:    General: Skin is warm.     Capillary Refill: Capillary refill takes less than 2 seconds.  Neurological:     Mental Status: She is alert. Mental status is at baseline.  Psychiatric:        Mood and Affect: Mood normal.     IMPRESSION / MDM / ASSESSMENT AND PLAN / ED COURSE  I reviewed the triage vital signs and the nursing notes.  Differential diagnosis including ACS, anemia, dehydration, electrolyte abnormality, urinary  tract infection, pulmonary embolism  Low risk Wells criteria will obtain a screening D-dimer  EKG  I, Corena Herter, the attending physician, personally viewed and interpreted this ECG.  Sinus tachycardia with a heart rate of 111.  Right axis.  Normal intervals.  No chamber enlargement.  No significant ST elevation or depression.  No findings of acute ischemia or dysrhythmia.  No tachycardic or bradycardic dysrhythmias while on cardiac telemetry.  RADIOLOGY I independently reviewed imaging, my interpretation of imaging: Chest x-ray with no signs of pneumonia  CT scan abdomen and pelvis without contrast with no acute findings.  LABS (all labs ordered are listed, but only abnormal results are displayed) Labs interpreted as -    Labs Reviewed  URINALYSIS, ROUTINE W REFLEX MICROSCOPIC - Abnormal; Notable for the following components:      Result Value   Color, Urine YELLOW (*)    APPearance HAZY (*)    Hgb urine dipstick LARGE (*)    Protein, ur 100 (*)    Bacteria, UA RARE (*)    All other components within normal limits  D-DIMER, QUANTITATIVE - Abnormal; Notable for  the following components:   D-Dimer, Quant 1.99 (*)    All other components within normal limits  LIPASE, BLOOD  COMPREHENSIVE METABOLIC PANEL  CBC  BRAIN NATRIURETIC PEPTIDE  POC URINE PREG, ED  TROPONIN I (HIGH SENSITIVITY)     MDM    D-dimer is positive.  Will obtain CTA to further evaluate.  No significant leukocytosis or anemia.  Creatinine appears to be at baseline.  No significant electrolyte abnormality.  No findings of an obstructing kidney stone.  No findings concerning for urinary tract infection.  Low risk heart score and troponin is negative have low suspicion for ACS.  Patient given IV fluids, antiemetics and Toradol.  On reevaluation continues to have pain, will give a dose of IV morphine.  Care transferred to incoming provider with CTA in process.   PROCEDURES:  Critical Care performed: No  Procedures  Patient's presentation is most consistent with acute presentation with potential threat to life or bodily function.   MEDICATIONS ORDERED IN ED: Medications  morphine (PF) 4 MG/ML injection 4 mg (has no administration in time range)  sodium chloride 0.9 % bolus 500 mL (500 mLs Intravenous Bolus 05/31/23 2236)  ketorolac (TORADOL) 15 MG/ML injection 15 mg (15 mg Intravenous Given 05/31/23 2236)  iohexol (OMNIPAQUE) 350 MG/ML injection 75 mL (75 mLs Intravenous Contrast Given 05/31/23 2326)    FINAL CLINICAL IMPRESSION(S) / ED DIAGNOSES   Final diagnoses:  Vaginal bleeding  Chest pain, unspecified type     Rx / DC Orders   ED Discharge Orders     None        Note:  This document was prepared using Dragon voice recognition software and may include unintentional dictation errors.   Corena Herter, MD 05/31/23 2328

## 2023-05-31 NOTE — ED Provider Triage Note (Signed)
Emergency Medicine Provider Triage Evaluation Note  Jasmine Price , a 28 y.o. female  was evaluated in triage.  Pt complains of left upper quadrant pain since yesterday. Pain is sharp and stabbing when she takes a deep breath. No nausea, vomiting, or diarrhea.  Physical Exam  BP 103/63   Pulse (!) 115   Temp 98 F (36.7 C) (Oral)   Resp 16   Ht 5\' 1"  (1.549 m)   LMP 04/24/2023 (Approximate)   SpO2 98%   BMI 35.79 kg/m  Gen:   Awake, no distress   Resp:  Normal effort  MSK:   Moves extremities without difficulty  Other:    Medical Decision Making  Medically screening exam initiated at 5:39 PM.  Appropriate orders placed.  Jasmine Price was informed that the remainder of the evaluation will be completed by another provider, this initial triage assessment does not replace that evaluation, and the importance of remaining in the ED until their evaluation is complete.     Chinita Pester, FNP 05/31/23 1740

## 2023-05-31 NOTE — ED Triage Notes (Addendum)
Pt to ed from home via POV for vag bleeding and pain during urination. Pt is due for her cycle. Pt was seen at home by some virtual clinic for same symptoms. They did flu/covid and urine test and was NEG for all the things. They also gave her an IM injection of Toradol. Pt is caox4, in no acute distress and ambulatory in triage, Pt is also having some pain in her left rib cage area when she sneezes or takes a deep breath.

## 2023-06-01 MED ORDER — APIXABAN (ELIQUIS) VTE STARTER PACK (10MG AND 5MG): ORAL_TABLET | ORAL | 0 refills | Status: DC

## 2023-06-01 MED ORDER — OXYCODONE HCL 5 MG PO TABS: 5.0000 mg | ORAL_TABLET | Freq: Four times a day (QID) | ORAL | 0 refills | Status: AC | PRN

## 2023-06-01 NOTE — Discharge Instructions (Signed)
You were seen in the ER today for evaluation of your upper abdominal pain.  Your CT scan did show that you have a blood clot on the right side of your lung.  We did not see any emergency left-sided findings, but it is important that we treat the blood clot in your lung.  I sent a prescription for a blood thinner to your pharmacy.  Please take these as directed.  Arrange close follow-up with your primary care doctor in about a week.  You can take Tylenol to help with your pain.  Avoid NSAIDs such as ibuprofen or Aleve as this can increase your bleeding risk with blood thinners.  If you have breakthrough pain, if sent a short course of narcotic medicine to your pharmacy.  Do not drive or operate machinery when taking this as it can make you drowsy.  Return to the ER for any new or worsening symptoms.

## 2023-06-01 NOTE — ED Provider Notes (Signed)
Care of this patient assumed from prior physician at 2300 pending CTA chest and disposition. Please see prior physician note for further details.  Briefly this is a 28 year old female presenting with left upper abdominal pain.  CT renal stone with nonobstructing right 1 mm stone, urine without evidence of infection.  No other acute findings.  D-dimer elevated at 1.99, CTA chest ordered, pending at time of signout.  Notified by radiology that patient's CT did demonstrate a right distal segmental/subsegmental PE, no other acute findings, no right heart strain.  Patient updated on results of workup.  Heart rate in the 80s, satting 100% on room air without respiratory difficulty.  Very low risk PESI score.  I did discuss the possibility of admission in the setting of her pulmonary embolism, but was agreeable with plan for discharge.  She does not have a personal or family history of bleeding disorder.  She is having some vaginal bleeding, and did discuss that she may have increased bleeding on a blood thinner.  Discussed strict return precautions.  She does have a primary care doctor with whom she can arrange close outpatient follow-up.  Prescription for Eliquis VTE starter pack sent to patient's pharmacy.  Also discharged with short course of pain medication.  Strict return precautions provided.  Patient discharged in stable condition.   Trinna Post, MD 06/01/23 [place]

## 2023-06-17 ENCOUNTER — Other Ambulatory Visit: Payer: Self-pay | Admitting: Psychiatry

## 2023-06-17 DIAGNOSIS — F39 Unspecified mood [affective] disorder: Secondary | ICD-10-CM

## 2023-06-17 DIAGNOSIS — F4310 Post-traumatic stress disorder, unspecified: Secondary | ICD-10-CM

## 2023-06-17 DIAGNOSIS — F411 Generalized anxiety disorder: Secondary | ICD-10-CM

## 2023-07-15 ENCOUNTER — Inpatient Hospital Stay

## 2023-07-15 ENCOUNTER — Inpatient Hospital Stay: Attending: Oncology | Admitting: Oncology

## 2023-07-15 ENCOUNTER — Encounter: Payer: Self-pay | Admitting: Oncology

## 2023-07-15 ENCOUNTER — Telehealth: Payer: Self-pay | Admitting: *Deleted

## 2023-07-15 VITALS — BP 122/86 | HR 86 | Temp 97.4°F | Resp 16 | Ht 61.0 in | Wt 166.3 lb

## 2023-07-15 DIAGNOSIS — F17290 Nicotine dependence, other tobacco product, uncomplicated: Secondary | ICD-10-CM | POA: Diagnosis not present

## 2023-07-15 DIAGNOSIS — Z7901 Long term (current) use of anticoagulants: Secondary | ICD-10-CM | POA: Diagnosis not present

## 2023-07-15 DIAGNOSIS — I2699 Other pulmonary embolism without acute cor pulmonale: Secondary | ICD-10-CM

## 2023-07-15 LAB — ANTITHROMBIN III: AntiThromb III Func: 112 % (ref 75–120)

## 2023-07-15 MED ORDER — RIVAROXABAN 20 MG PO TABS: 20.0000 mg | ORAL_TABLET | Freq: Every day | ORAL | 2 refills | Status: DC

## 2023-07-15 NOTE — Telephone Encounter (Signed)
 She wants you to know her mother has  TTP also.

## 2023-07-15 NOTE — Progress Notes (Signed)
 Was seen in the ED for PE on 1/31. She is taking eliquis 5 mg BID. States hse does have a hard time remembering to take it at night. Still having issues with SOB. Having to use her inhaler more this past week. Had some dizziness last night with the SOB.

## 2023-07-15 NOTE — Progress Notes (Signed)
 [facility]  Telephone:([phone] Fax:(336) [phone]  ID: Noreene Larsson OB: [birth year]  MR#: [id number]  QIO#:[id number]  Patient Care Team: Jodi Marble, NP as PCP - General (Nurse Practitioner) Jodi Marble, NP as Nurse Practitioner (Nurse Practitioner) Jeralyn Ruths, MD as Consulting Physician (Oncology)  CHIEF COMPLAINT: Incidental right sided PE.  INTERVAL HISTORY: Patient is a 28 year old female who presented to the emergency room with left sided abdominal/flank pain associated with shortness of breath.  Imaging was essentially negative, but patient incidentally was noted to have a pulmonary embolism on her right side.  She currently feels improved, but not back to baseline.  She has no neurologic complaints.  She denies any recent fevers or illnesses.  She has a good appetite and denies weight loss.  She has no chest pain, shortness of breath, cough, or hemoptysis.  She denies any nausea, vomiting, constipation, or diarrhea.  She has no urinary complaints.  Patient offers no further specific complaints today.  REVIEW OF SYSTEMS:   Review of Systems  Constitutional: Negative.  Negative for fever, malaise/fatigue and weight loss.  Respiratory:  Positive for shortness of breath. Negative for cough and hemoptysis.   Cardiovascular: Negative.  Negative for chest pain and leg swelling.  Gastrointestinal: Negative.  Negative for abdominal pain.  Genitourinary:  Negative for dysuria.  Musculoskeletal:  Negative for back pain.  Skin: Negative.  Negative for rash.  Neurological: Negative.  Negative for dizziness, focal weakness, weakness and headaches.  Endo/Heme/Allergies:  Bruises/bleeds easily.  Psychiatric/Behavioral: Negative.  The patient is not nervous/anxious.     As per HPI. Otherwise, a complete review of systems is negative.  PAST MEDICAL HISTORY: Past Medical History:  Diagnosis Date   Anxiety    Depression    HIV disease (HCC)     UNDETECTABLE   Pregnancy, ectopic, cornual or cervical 08/06/2021   Left wedge resection of cornual ectopic pregnancy    PAST SURGICAL HISTORY: Past Surgical History:  Procedure Laterality Date   TYMPANOSTOMY TUBE PLACEMENT Right    2013 and previously as a child   WISDOM TOOTH EXTRACTION  2021   three;   XI ROBOTIC ASSISTED SALPINGECTOMY Left 08/06/2021   Procedure: XI ROBOTIC ASSISTED  Removal of Cornual Pregnancy;  Surgeon: Natale Milch, MD;  Location: [facility] ORS;  Service: Gynecology;  Laterality: Left;    FAMILY HISTORY: Family History  Problem Relation Age of Onset   Hypertension Mother    Eclampsia Mother        preeclampsia   Schizophrenia Father    Gout Father    Bipolar disorder Father    Healthy Brother    Healthy Brother    Cancer Maternal Grandmother 12       lung   Diabetes Maternal Grandmother    Diabetes Maternal Grandfather    COPD Maternal Grandfather    Cystic fibrosis Son        carrier   ADD / ADHD Son    Healthy Half-Brother    Healthy Half-Sister    Healthy Half-Sister    Healthy Half-Sister     ADVANCED DIRECTIVES (Y/N):  N  HEALTH MAINTENANCE: Social History   Tobacco Use   Smoking status: Former    Types: Cigarettes   Smokeless tobacco: Never  Vaping Use   Vaping status: Every Day   Devices: for a month then stopped  Substance Use Topics   Alcohol use: No    Comment: rarely   Drug use: Never     Colonoscopy:  PAP:  Bone density:  Lipid panel:  No Known Allergies  Current Outpatient Medications  Medication Sig Dispense Refill   BIKTARVY 50-200-25 MG TABS tablet Take 1 tablet by mouth daily.     buPROPion (WELLBUTRIN XL) 300 MG 24 hr tablet Take 300 mg by mouth daily.     cholecalciferol (VITAMIN D3) 25 MCG (1000 UNIT) tablet Take 1,000 Units by mouth daily.     ELIQUIS 5 MG TABS tablet Take 5 mg by mouth 2 (two) times daily.     hydrocortisone (ANUSOL-HC) 2.5 % rectal cream Apply 1 Application topically as needed.      mometasone (ELOCON) 0.1 % cream Apply 1 Application topically 2 (two) times daily.     PREVIDENT 5000 ENAMEL PROTECT 1.1-5 % GEL Apply a small amount to teeth twice a day     sertraline (ZOLOFT) 50 MG tablet TAKE 1 TABLET(50 MG) BY MOUTH DAILY WITH BREAKFAST 90 tablet 0   triamcinolone ointment (KENALOG) 0.1 % Apply topically as needed.     VENTOLIN HFA 108 (90 Base) MCG/ACT inhaler Inhale 1-2 puffs into the lungs every 6 (six) hours as needed.     Vitamin D, Ergocalciferol, (DRISDOL) 1.25 MG (50000 UNIT) CAPS capsule SMARTSIG:1.0 Capsule(s) By Mouth Once a Week     WEGOVY 0.25 MG/0.5ML SOAJ      WEGOVY 0.5 MG/0.5ML SOAJ Inject into the skin.     WEGOVY 1 MG/0.5ML SOAJ Inject into the skin.     cetirizine (ZYRTEC) 10 MG tablet Take by mouth daily. (Patient not taking: Reported on 07/15/2023)     fluticasone (FLONASE) 50 MCG/ACT nasal spray as needed. (Patient not taking: Reported on 07/15/2023)     No current facility-administered medications for this visit.    OBJECTIVE: Vitals:   07/15/23 1107  BP: 122/86  Pulse: 86  Resp: 16  Temp: (!) 97.4 F (36.3 C)  SpO2: 100%     Body mass index is 31.42 kg/m.    ECOG FS:1 - Symptomatic but completely ambulatory  General: Well-developed, well-nourished, no acute distress. Eyes: Pink conjunctiva, anicteric sclera. HEENT: Normocephalic, moist mucous membranes. Lungs: No audible wheezing or coughing. Heart: Regular rate and rhythm. Abdomen: Soft, nontender, no obvious distention. Musculoskeletal: No edema, cyanosis, or clubbing. Neuro: Alert, answering all questions appropriately. Cranial nerves grossly intact. Skin: No rashes or petechiae noted. Psych: Normal affect. Lymphatics: No cervical, calvicular, axillary or inguinal LAD.   LAB RESULTS:  Lab Results  Component Value Date   NA 138 05/31/2023   K 3.9 05/31/2023   CL 102 05/31/2023   CO2 23 05/31/2023   GLUCOSE 93 05/31/2023   BUN 12 05/31/2023   CREATININE 0.74  05/31/2023   CALCIUM 9.4 05/31/2023   PROT 7.8 05/31/2023   ALBUMIN 4.9 05/31/2023   AST 16 05/31/2023   ALT 24 05/31/2023   ALKPHOS 60 05/31/2023   BILITOT 0.7 05/31/2023   GFRNONAA >60 05/31/2023    Lab Results  Component Value Date   WBC 7.9 05/31/2023   NEUTROABS 6.8 02/06/2022   HGB 14.8 05/31/2023   HCT 43.3 05/31/2023   MCV 87.8 05/31/2023   PLT 316 05/31/2023     STUDIES: No results found.  ASSESSMENT: Incidental right sided PE.  PLAN:    Incidental right sided PE: Patient's symptoms were on her left side and PE was noted on the right.  She has no personal or family history of blood clot.  She does not appear to have any transient risk factors.  Patient  does not believe she is pregnant.  Will do a full hypercoagulable workup today for completeness.  If negative, recommendation will be 3 months of anticoagulation given the small size of her pulmonary embolism.  If hypercoagulable workup is positive, patient will likely require a minimum of 6 months of treatment, possibly more.  She has also requested to transition to once a day Xarelto given her difficulty taking medications at night.  She has been instructed to discontinue Eliquis today and initiate 20 ohms daily Xarelto tomorrow.  Patient will have video-assisted telemedicine visit in 1 month to discuss her results.  I spent a total of 45 minutes reviewing chart data, face-to-face evaluation with the patient, counseling and coordination of care as detailed above.  Patient expressed understanding and was in agreement with this plan. She also understands that She can call clinic at any time with any questions, concerns, or complaints.     Jeralyn Ruths, MD   07/15/2023 1:43 PM

## 2023-07-16 LAB — LUPUS ANTICOAGULANT PANEL
DRVVT: 75.4 s — ABNORMAL HIGH (ref 0.0–47.0)
PTT Lupus Anticoagulant: 39.1 s (ref 0.0–43.5)

## 2023-07-16 LAB — DRVVT CONFIRM: dRVVT Confirm: 1.2 ratio (ref 0.8–1.2)

## 2023-07-16 LAB — PROTEIN S ACTIVITY: Protein S Activity: 111 % (ref 63–140)

## 2023-07-16 LAB — DRVVT MIX: dRVVT Mix: 56.5 s — ABNORMAL HIGH (ref 0.0–40.4)

## 2023-07-16 LAB — CARDIOLIPIN ANTIBODIES, IGG, IGM, IGA
Anticardiolipin IgA: <9 U/mL (ref 0–11)
Anticardiolipin IgG: <9 GPL U/mL (ref 0–14)
Anticardiolipin IgM: <9 [MPL'U]/mL (ref 0–12)

## 2023-07-16 LAB — PROTEIN S, TOTAL: Protein S Ag, Total: 97 % (ref 60–150)

## 2023-07-16 LAB — PROTEIN C ACTIVITY: Protein C Activity: 136 % (ref 73–180)

## 2023-07-17 LAB — PROTEIN C, TOTAL: Protein C, Total: 128 % (ref 60–150)

## 2023-07-19 LAB — PROTHROMBIN GENE MUTATION

## 2023-07-19 LAB — FACTOR 5 LEIDEN

## 2023-08-12 ENCOUNTER — Telehealth: Payer: Self-pay | Admitting: *Deleted

## 2023-08-12 MED ORDER — RIVAROXABAN 20 MG PO TABS: 20.0000 mg | ORAL_TABLET | Freq: Every day | ORAL | 2 refills | Status: DC

## 2023-08-12 NOTE — Telephone Encounter (Signed)
 I sent the refill xarelto to [facility] and sent it to be signed, pt understands.

## 2023-08-15 ENCOUNTER — Inpatient Hospital Stay: Attending: Oncology | Admitting: Oncology

## 2023-08-15 DIAGNOSIS — D6851 Activated protein C resistance: Secondary | ICD-10-CM

## 2023-08-15 NOTE — Progress Notes (Signed)
 [facility]  Telephone:(336) [phone] Fax:(336) [phone]  ID: Noreene Larsson OB: January 01, 1996  MR#: [id number]  UXL#:[id number]  Patient Care Team: Jodi Marble, NP as PCP - General (Nurse Practitioner) Jodi Marble, NP as Nurse Practitioner (Nurse Practitioner) Jeralyn Ruths, MD as Consulting Physician (Oncology)  I connected with Jillayne Miedema on 08/15/23 at  3:30 PM EDT by video enabled telemedicine visit and verified that I am speaking with the correct person using two identifiers.   I discussed the limitations, risks, security and privacy concerns of performing an evaluation and management service by telemedicine and the availability of in-person appointments. I also discussed with the patient that there may be a patient responsible charge related to this service. The patient expressed understanding and agreed to proceed.   Other persons participating in the visit and their role in the encounter: Patient, MD.  Patient's location: Home. Provider's location: Clinic.  CHIEF COMPLAINT: Heterozygote for factor V Leiden.    INTERVAL HISTORY: Patient agreed to video-assisted telemedicine visit for further evaluation and discussion of her laboratory results.  She currently feels well and is asymptomatic.  She has no further flank pain. She has no neurologic complaints.  She denies any recent fevers or illnesses.  She has a good appetite and denies weight loss.  She has no chest pain, shortness of breath, cough, or hemoptysis.  She denies any nausea, vomiting, constipation, or diarrhea.  She has no urinary complaints.  Patient offers no specific complaints today.  REVIEW OF SYSTEMS:   Review of Systems  Constitutional: Negative.  Negative for fever, malaise/fatigue and weight loss.  Respiratory: Negative.  Negative for cough, hemoptysis and shortness of breath.   Cardiovascular: Negative.  Negative for chest pain and leg swelling.  Gastrointestinal:  Negative.  Negative for abdominal pain.  Genitourinary: Negative.  Negative for dysuria.  Musculoskeletal:  Negative for back pain.  Skin: Negative.  Negative for rash.  Neurological: Negative.  Negative for dizziness, focal weakness, weakness and headaches.  Endo/Heme/Allergies:  Does not bruise/bleed easily.  Psychiatric/Behavioral: Negative.  The patient is not nervous/anxious.     As per HPI. Otherwise, a complete review of systems is negative.  PAST MEDICAL HISTORY: Past Medical History:  Diagnosis Date   Anxiety    Depression    HIV disease (HCC)    UNDETECTABLE   Pregnancy, ectopic, cornual or cervical 08/06/2021   Left wedge resection of cornual ectopic pregnancy    PAST SURGICAL HISTORY: Past Surgical History:  Procedure Laterality Date   TYMPANOSTOMY TUBE PLACEMENT Right    2013 and previously as a child   WISDOM TOOTH EXTRACTION  2021   three;   XI ROBOTIC ASSISTED SALPINGECTOMY Left 08/06/2021   Procedure: XI ROBOTIC ASSISTED  Removal of Cornual Pregnancy;  Surgeon: Natale Milch, MD;  Location: [facility] ORS;  Service: Gynecology;  Laterality: Left;    FAMILY HISTORY: Family History  Problem Relation Age of Onset   Hypertension Mother    Eclampsia Mother        preeclampsia   Schizophrenia Father    Gout Father    Bipolar disorder Father    Healthy Brother    Healthy Brother    Cancer Maternal Grandmother 61       lung   Diabetes Maternal Grandmother    Diabetes Maternal Grandfather    COPD Maternal Grandfather    Cystic fibrosis Son        carrier   ADD / ADHD Son    Healthy Half-Brother  Healthy Half-Sister    Healthy Half-Sister    Healthy Half-Sister     ADVANCED DIRECTIVES (Y/N):  N  HEALTH MAINTENANCE: Social History   Tobacco Use   Smoking status: Former    Types: Cigarettes   Smokeless tobacco: Never  Vaping Use   Vaping status: Every Day   Devices: for a month then stopped  Substance Use Topics   Alcohol use: No     Comment: rarely   Drug use: Never     Colonoscopy:  PAP:  Bone density:  Lipid panel:  No Known Allergies  Current Outpatient Medications  Medication Sig Dispense Refill   BIKTARVY 50-200-25 MG TABS tablet Take 1 tablet by mouth daily.     buPROPion (WELLBUTRIN XL) 300 MG 24 hr tablet Take 300 mg by mouth daily.     cetirizine (ZYRTEC) 10 MG tablet Take by mouth daily. (Patient not taking: Reported on 07/15/2023)     cholecalciferol (VITAMIN D3) 25 MCG (1000 UNIT) tablet Take 1,000 Units by mouth daily.     ELIQUIS 5 MG TABS tablet Take 5 mg by mouth 2 (two) times daily.     fluticasone (FLONASE) 50 MCG/ACT nasal spray as needed. (Patient not taking: Reported on 07/15/2023)     hydrocortisone (ANUSOL-HC) 2.5 % rectal cream Apply 1 Application topically as needed.     mometasone (ELOCON) 0.1 % cream Apply 1 Application topically 2 (two) times daily.     PREVIDENT 5000 ENAMEL PROTECT 1.1-5 % GEL Apply a small amount to teeth twice a day     rivaroxaban (XARELTO) 20 MG TABS tablet Take 1 tablet (20 mg total) by mouth daily with supper. 30 tablet 2   sertraline (ZOLOFT) 50 MG tablet TAKE 1 TABLET(50 MG) BY MOUTH DAILY WITH BREAKFAST 90 tablet 0   triamcinolone ointment (KENALOG) 0.1 % Apply topically as needed.     VENTOLIN HFA 108 (90 Base) MCG/ACT inhaler Inhale 1-2 puffs into the lungs every 6 (six) hours as needed.     Vitamin D, Ergocalciferol, (DRISDOL) 1.25 MG (50000 UNIT) CAPS capsule SMARTSIG:1.0 Capsule(s) By Mouth Once a Week     WEGOVY 0.25 MG/0.5ML SOAJ      WEGOVY 0.5 MG/0.5ML SOAJ Inject into the skin.     WEGOVY 1 MG/0.5ML SOAJ Inject into the skin.     No current facility-administered medications for this visit.    OBJECTIVE: There were no vitals filed for this visit.    There is no height or weight on file to calculate BMI.    ECOG FS:0 - Asymptomatic  General: Well-developed, well-nourished, no acute distress. HEENT: Normocephalic. Neuro: Alert, answering all  questions appropriately. Cranial nerves grossly intact. Psych: Normal affect.  LAB RESULTS:  Lab Results  Component Value Date   NA 138 05/31/2023   K 3.9 05/31/2023   CL 102 05/31/2023   CO2 23 05/31/2023   GLUCOSE 93 05/31/2023   BUN 12 05/31/2023   CREATININE 0.74 05/31/2023   CALCIUM 9.4 05/31/2023   PROT 7.8 05/31/2023   ALBUMIN 4.9 05/31/2023   AST 16 05/31/2023   ALT 24 05/31/2023   ALKPHOS 60 05/31/2023   BILITOT 0.7 05/31/2023   GFRNONAA >60 05/31/2023    Lab Results  Component Value Date   WBC 7.9 05/31/2023   NEUTROABS 6.8 02/06/2022   HGB 14.8 05/31/2023   HCT 43.3 05/31/2023   MCV 87.8 05/31/2023   PLT 316 05/31/2023     STUDIES: No results found.  ASSESSMENT: Heterozygote for factor  V Leiden.  PLAN:    Heterozygote for factor V Leiden: Patient has no personal history of blood clot up until now.  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  She also reports her mother did not have any DVT, but also had multiple miscarriages.  The remainder of her hypercoagulable workup was negative.  After lengthy discussion with the patient, given the minimal size of her pulmonary embolism and the fact that she was asymptomatic and was found incidentally, she was instructed to refill Xarelto for 1 additional month and then discontinue treatment.  She is not interested in lifelong anticoagulation at this time.  Patient has approximately 6-8 fold increased of additional blood clot over the general population and we discussed minimizing risk factors and DVT prevention strategies including compression stockings on long car rides as well as minimize time being stationary.  If she has a second DVT or PE for any reason, would recommend lifelong anticoagulation at that point.  Patient also reports she is trying to become pregnant and has an appointment with maternal-fetal medicine at [facility] in the near future.  Have recommended keeping this appointment as scheduled and to discuss whether or not  prophylactic Lovenox would be beneficial.   Disposition: No follow-up has been scheduled at this time but please refer patient back if there are any questions or concerns.  I provided 30 minutes of face-to-face video visit time during this encounter which included chart review, counseling, and coordination of care as documented above.   Patient expressed understanding and was in agreement with this plan. She also understands that She can call clinic at any time with any questions, concerns, or complaints.     Shellie Dials, MD   08/15/2023 4:59 PM

## 2023-08-26 ENCOUNTER — Emergency Department
Admission: EM | Admit: 2023-08-26 | Discharge: 2023-08-26 | Disposition: A | Discharge status: Home / Self Care | Attending: Emergency Medicine | Admitting: Emergency Medicine

## 2023-08-26 ENCOUNTER — Other Ambulatory Visit: Payer: Self-pay

## 2023-08-26 ENCOUNTER — Telehealth (INDEPENDENT_AMBULATORY_CARE_PROVIDER_SITE_OTHER): Payer: Self-pay | Admitting: Psychiatry

## 2023-08-26 ENCOUNTER — Emergency Department

## 2023-08-26 DIAGNOSIS — Z7901 Long term (current) use of anticoagulants: Secondary | ICD-10-CM | POA: Insufficient documentation

## 2023-08-26 DIAGNOSIS — S0990XA Unspecified injury of head, initial encounter: Secondary | ICD-10-CM | POA: Insufficient documentation

## 2023-08-26 DIAGNOSIS — W1809XA Striking against other object with subsequent fall, initial encounter: Secondary | ICD-10-CM | POA: Diagnosis not present

## 2023-08-26 DIAGNOSIS — R41840 Attention and concentration deficit: Secondary | ICD-10-CM

## 2023-08-26 DIAGNOSIS — F4310 Post-traumatic stress disorder, unspecified: Secondary | ICD-10-CM

## 2023-08-26 DIAGNOSIS — F39 Unspecified mood [affective] disorder: Secondary | ICD-10-CM

## 2023-08-26 DIAGNOSIS — S0181XA Laceration without foreign body of other part of head, initial encounter: Secondary | ICD-10-CM | POA: Insufficient documentation

## 2023-08-26 DIAGNOSIS — F411 Generalized anxiety disorder: Secondary | ICD-10-CM

## 2023-08-26 MED ORDER — LIDOCAINE HCL (PF) 1 % IJ SOLN
5.0000 mL | Freq: Once | INTRAMUSCULAR | Status: AC
  Administered 2023-08-26: 5 mL
  Filled 2023-08-26: qty 5

## 2023-08-26 NOTE — ED Notes (Signed)
 PA at bedside at this time.

## 2023-08-26 NOTE — ED Triage Notes (Signed)
 Pt to ED via POV from home. Pt reports was working on a garage and hit herself on the right side of the head with a sharp hammer edge. Pt bleeding controlled at this time. [facility] and told to come to ED. Pt reports recently off blood thinner after having PE.

## 2023-08-26 NOTE — ED Provider Notes (Signed)
 [facility] Provider Note    Event Date/Time   First MD Initiated Contact with Patient 08/26/23 1613     (approximate)   History   Head Injury (/)    HPI  Jasmine Price is a 28 y.o. female      with a past medical history of heterozygous V Leiden mutation, pulmonary embolism, HIV positive who presents to the ED complaining of fall.    According to the patient, she was cleaning the garage and a sharp hammer hit her head in the  right side.  Patient states there was active bleeding, denies loss of consciousness, blurry vision.  Patient was taking Eliquis  a week ago after pulmonary embolism.  Last Tdap a year ago.      Physical Exam   Triage Vital Signs: ED Triage Vitals [08/26/23 1515]  Encounter Vitals Group     BP 135/85     Systolic BP Percentile      Diastolic BP Percentile      Pulse Rate (!) 107     Resp 20     Temp 98.5 F (36.9 C)     Temp Source Oral     SpO2 100 %     Weight      Height      Head Circumference      Peak Flow      Pain Score 5     Pain Loc      Pain Education      Exclude from Growth Chart     Most recent vital signs: Vitals:   08/26/23 1515  BP: 135/85  Pulse: (!) 107  Resp: 20  Temp: 98.5 F (36.9 C)  SpO2: 100%     Constitutional: Alert, NAD. Able to speak in complete sentences without cough or dyspnea  Eyes: Conjunctivae are normal.  Head: Right frontal area: Presence of laceration about 0.5 cm 5 mm deep.  No active bleeding Nose: No congestion/rhinnorhea. Mouth/Throat: Mucous membranes are moist.   Neck: Painless ROM. Supple. No JVD, nodes, thyromegaly  Cardiovascular:   Good peripheral circulation.RRR no murmurs, gallops, rubs  Respiratory: Normal respiratory effort.  No retractions. Clear to auscultation bilaterally without wheezing or crackles  Gastrointestinal: Soft and nontender.  Musculoskeletal:  no deformity Neurologic:  MAE spontaneously. No gross focal neurologic deficits are  appreciated.  Skin:  Skin is warm, dry and intact. No rash noted. Psychiatric: Mood and affect are normal. Speech and behavior are normal.    ED Results / Procedures / Treatments   Labs (all labs ordered are listed, but only abnormal results are displayed) Labs Reviewed - No data to display    EKG     RADIOLOGY I independently reviewed and interpreted imaging and agree with radiologists findings.      PROCEDURES:  Critical Care performed:   .Laceration Repair  Date/Time: 08/26/2023 5:03 PM  Performed by: Awilda Lennox, PA-C Authorized by: Awilda Lennox, PA-C   Consent:    Consent obtained:  Verbal   Consent given by:  Patient   Risks, benefits, and alternatives were discussed: yes     Risks discussed:  Infection and pain Universal protocol:    Procedure explained and questions answered to patient or proxy's satisfaction: yes     Patient identity confirmed:  Verbally with patient Laceration details:    Location:  Face   Face location:  Forehead   Length (cm):  0.5   Depth (mm):  5 Pre-procedure details:    Preparation:  Patient was prepped and draped in usual sterile fashion Treatment:    Area cleansed with:  Povidone-iodine    Amount of cleaning:  Standard   Irrigation solution:  Sterile saline   Irrigation volume:  250   Scar revision: no   Skin repair:    Repair method:  Sutures   Suture size:  5-0   Suture material:  Nylon   Suture technique:  Simple interrupted   Number of sutures:  2 Approximation:    Approximation:  Close Repair type:    Repair type:  Simple Post-procedure details:    Dressing:  Non-adherent dressing   Procedure completion:  Tolerated well, no immediate complications    MEDICATIONS ORDERED IN ED: Medications  lidocaine  (PF) (XYLOCAINE ) 1 % injection 5 mL (5 mLs Other Given 08/26/23 1636)   Clinical Course as of 08/26/23 1808  Mon Aug 26, 2023  1757 CT Head Wo Contrast No acute intracranial abnormality. [AE]  1802  Updated patient, CT of the head was normal patient is going to be discharged.  [AE]    Clinical Course User Index [AE] Awilda Lennox, PA-C    IMPRESSION / MDM / ASSESSMENT AND PLAN / ED COURSE  I reviewed the triage vital signs and the nursing notes.  Differential diagnosis includes, but is not limited to, intracranial hemorrhage, laceration, fracture  Patient's presentation is most consistent with acute complicated illness / injury requiring diagnostic workup.   Patient's diagnosis is consistent with frontal laceration. I independently reviewed and interpreted imaging and agree with radiologists findings no fractures or intracranial hemorrhage . I did review the patient's allergies and medications.The patient is in stable and satisfactory condition for discharge home  Patient will be discharged home without prescriptions. . Patient is to follow up with PCP as needed or otherwise directed. Patient is given ED precautions to return to the ED for any worsening or new symptoms. Discussed plan of care with patient, answered all of patient's questions, Patient agreeable to plan of care. Patient verbalized understanding.    FINAL CLINICAL IMPRESSION(S) / ED DIAGNOSES   Final diagnoses:  Injury of head, initial encounter  Facial laceration, initial encounter     Rx / DC Orders   ED Discharge Orders     None        Note:  This document was prepared using Dragon voice recognition software and may include unintentional dictation errors.   Awilda Lennox, PA-C 08/26/23 Jasmine Price    Arline Bennett, MD 08/26/23 [phone]

## 2023-08-26 NOTE — Progress Notes (Signed)
 Patient currently at the emergency department since she got hurt.  Contacted patient by phone.  She agrees to call back to schedule another appointment once she gets out of the hospital since she will not be able to keep today's appointment.

## 2023-08-26 NOTE — Discharge Instructions (Addendum)
 You have been diagnosed with head trauma, facial laceration.  You can remove the Band-Aid tomorrow.  Please call your PCP or go to urgent care to remove the suture in 6 days.  Please do not exposed the repair area to the sun.  Come back to ED with your PCP if you have new symptoms or symptoms worsen.  Can take acetaminophen  for pain every 6 hours.

## 2023-10-07 ENCOUNTER — Telehealth: Payer: Self-pay | Admitting: Psychiatry

## 2023-10-07 ENCOUNTER — Other Ambulatory Visit: Payer: Self-pay | Admitting: Psychiatry

## 2023-10-07 DIAGNOSIS — F4310 Post-traumatic stress disorder, unspecified: Secondary | ICD-10-CM

## 2023-10-07 DIAGNOSIS — F39 Unspecified mood [affective] disorder: Secondary | ICD-10-CM

## 2023-10-07 DIAGNOSIS — F411 Generalized anxiety disorder: Secondary | ICD-10-CM

## 2023-10-07 NOTE — Telephone Encounter (Signed)
 Please contact patient to schedule a follow-up appointment.  Once appointment is scheduled will provide future refills.  Last appointment was in January 2025.  Patient missed her appointment in April.

## 2023-10-07 NOTE — Telephone Encounter (Signed)
 Will wait for her to call us  back for  future refills.

## 2023-10-10 NOTE — Telephone Encounter (Signed)
 received fax requesting a refill on the sertralline. pt was last seen on 4-28 no follow up

## 2023-10-10 NOTE — Telephone Encounter (Signed)
 Patient was last seen on 05/30/2023.  She did not complete her appointment on 08/26/2023.  Staff has been trying to get in touch with her to schedule an appointment.  I have sent a 30-day supply for sertraline , no future refills without an appointment.

## 2023-10-11 NOTE — Telephone Encounter (Signed)
 pt was notified she stated that she will try and call back next week to set up an appt. 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

## 2023-11-20 ENCOUNTER — Other Ambulatory Visit: Payer: Self-pay | Admitting: Psychiatry

## 2023-11-20 DIAGNOSIS — F411 Generalized anxiety disorder: Secondary | ICD-10-CM

## 2023-11-20 DIAGNOSIS — F39 Unspecified mood [affective] disorder: Secondary | ICD-10-CM

## 2023-11-20 DIAGNOSIS — F4310 Post-traumatic stress disorder, unspecified: Secondary | ICD-10-CM

## 2023-12-17 ENCOUNTER — Other Ambulatory Visit: Payer: Self-pay | Admitting: Psychiatry

## 2023-12-17 DIAGNOSIS — F4310 Post-traumatic stress disorder, unspecified: Secondary | ICD-10-CM

## 2023-12-17 DIAGNOSIS — F39 Unspecified mood [affective] disorder: Secondary | ICD-10-CM

## 2023-12-17 DIAGNOSIS — F411 Generalized anxiety disorder: Secondary | ICD-10-CM

## 2023-12-19 ENCOUNTER — Telehealth (INDEPENDENT_AMBULATORY_CARE_PROVIDER_SITE_OTHER): Admitting: Psychiatry

## 2023-12-19 ENCOUNTER — Encounter: Payer: Self-pay | Admitting: Psychiatry

## 2023-12-19 DIAGNOSIS — F39 Unspecified mood [affective] disorder: Secondary | ICD-10-CM | POA: Diagnosis not present

## 2023-12-19 DIAGNOSIS — F4310 Post-traumatic stress disorder, unspecified: Secondary | ICD-10-CM | POA: Diagnosis not present

## 2023-12-19 DIAGNOSIS — F411 Generalized anxiety disorder: Secondary | ICD-10-CM

## 2023-12-19 MED ORDER — BUPROPION HCL 75 MG PO TABS: 75.0000 mg | ORAL_TABLET | ORAL | 0 refills | Status: DC

## 2023-12-19 NOTE — Progress Notes (Addendum)
 Virtual Visit via Video Note  I connected with Jasmine Price on 12/19/23 at 11:30 AM EDT by a video enabled telemedicine application and verified that I am speaking with the correct person using two identifiers.  Location Provider Location : [facility] Patient Location : Home  Participants: Patient , Provider   I discussed the limitations of evaluation and management by telemedicine and the availability of in person appointments. The patient expressed understanding and agreed to proceed.   I discussed the assessment and treatment plan with the patient. The patient was provided an opportunity to ask questions and all were answered. The patient agreed with the plan and demonstrated an understanding of the instructions.   The patient was advised to call back or seek an in-person evaluation if the symptoms worsen or if the condition fails to improve as anticipated.   BH MD OP Progress Note  12/19/2023 12:39 PM Jasmine Price  MRN:  [id number]  Chief Complaint:  Chief Complaint  Patient presents with   Follow-up   Anxiety   Medication Refill   Discussed the use of AI scribe software for clinical note transcription with the patient, who gave verbal consent to proceed.  History of Present Illness Jasmine Price is a 28 year old Caucasian female, self-employed, lives in [place], has a history of GAD, PTSD, episodic mood disorder, PCOS, attention and concentration deficit, allergic rhinitis, factor V Leyden heterozygote, history of HIV was evaluated by telemedicine today.  She is currently pregnant, 10 weeks.  She reports feeling stable on her current regimen and describes that sertraline  50 mg daily effectively manages her anxiety, including panic attacks and feelings of overwhelm. She states that she has not experienced recent irritability, agitation, or outbursts, and notes that her anxiety symptoms remain well controlled. She denies any significant lack of interest or pleasure in  activities, feelings of hopelessness, or depressed mood. She describes feeling 'hormonal'at times, which she relates to pregnancy, but does not associate these changes with worsening mood symptoms.  Over the past month, she has been tapering her bupropion  dose from 300 mg to 150 mg daily and has not noticed any worsening of mood symptoms since the reduction. She expresses a desire to discontinue bupropion  during pregnancy and feels comfortable continuing sertraline  for anxiety management.  She reports tolerable nightmares and denies flashbacks or intrusive trauma-related symptoms.  Sleep onset proves somewhat difficult for her, but she reports waking up earlier and feeling energized. She notes increased appetite, which she relates to pregnancy, and reports recent weight loss prior to pregnancy. She states that she is awaiting ADHD testing scheduled for October.  She endorses a history of depression a long time ago. She reports previous trauma history but denies current trauma-related symptoms. She denies any history of suicide attempts or suicidal ideation, and denies thoughts of harming others.  She denies current use of THC or other substances.  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 She lives with her husband and two children (ages 4 and 60). She reports a strong support system including her husband, uncle, neighbor, and several friends. Her mother resides in [place]  and maintains regular contact.  She has Factor V Leiden, heterozygote history of PE in January 2025.  Currently on Lovenox.   Visit Diagnosis:    ICD-10-CM   1. GAD (generalized anxiety disorder)  F41.1     2. PTSD (post-traumatic stress disorder)  F43.10 buPROPion  (WELLBUTRIN ) 75 MG tablet    3. Episodic mood disorder (HCC)  F39 buPROPion  (  WELLBUTRIN ) 75 MG tablet      Past Psychiatric History: I have reviewed past psychiatric history from progress note on 03/06/2023.  Past Medical History:   Past Medical History:  Diagnosis Date   Anxiety    Depression    HIV disease (HCC)    UNDETECTABLE   Pregnancy, ectopic, cornual or cervical 08/06/2021   Left wedge resection of cornual ectopic pregnancy    Past Surgical History:  Procedure Laterality Date   TYMPANOSTOMY TUBE PLACEMENT Right    2013 and previously as a child   WISDOM TOOTH EXTRACTION  2021   three;   XI ROBOTIC ASSISTED SALPINGECTOMY Left 08/06/2021   Procedure: XI ROBOTIC ASSISTED  Removal of Cornual Pregnancy;  Surgeon: Victor Claudell SAUNDERS, MD;  Location: [facility] ORS;  Service: Gynecology;  Laterality: Left;    Family Psychiatric History: I reviewed family psychiatric history from progress note on 03/06/2023.  Family History:  Family History  Problem Relation Age of Onset   Hypertension Mother    Eclampsia Mother        preeclampsia   Schizophrenia Father    Gout Father    Bipolar disorder Father    Healthy Brother    Healthy Brother    Cancer Maternal Grandmother 52       lung   Diabetes Maternal Grandmother    Diabetes Maternal Grandfather    COPD Maternal Grandfather    Cystic fibrosis Son        carrier   ADD / ADHD Son    Healthy Half-Brother    Healthy Half-Sister    Healthy Half-Sister    Healthy Half-Sister     Social History: I have reviewed social history from progress note on 03/06/2023. Social History   Socioeconomic History   Marital status: Married    Spouse name: Nancyann   Number of children: 2   Years of education: 14   Highest education level: Not on file  Occupational History   Occupation: Research scientist (medical)  Tobacco Use   Smoking status: Former    Types: Cigarettes   Smokeless tobacco: Never  Vaping Use   Vaping status: Every Day   Devices: for a month then stopped  Substance and Sexual Activity   Alcohol use: No    Comment: rarely   Drug use: Never   Sexual activity: Yes    Partners: Male    Birth control/protection: None  Other Topics Concern   Not on file  Social  History Narrative   Not on file   Social Drivers of Health   Financial Resource Strain: Medium Risk (08/01/2022)   Overall Financial Resource Strain (CARDIA)    Difficulty of Paying Living Expenses: Somewhat hard  Food Insecurity: No Food Insecurity (11/25/2023)   [facility]   Hunger Vital Sign    Within the past 12 months, you worried that your food would run out before you got the money to buy more.: Never true    Within the past 12 months, the food you bought just didn't last and you didn't have money to get more.: Never true  Transportation Needs: No Transportation Needs (11/25/2023)   [facility] from [facility]   PRAPARE - Transportation    Lack of Transportation (Medical): No    Lack of Transportation (Non-Medical): No  Physical Activity: Sufficiently Active (08/01/2022)   Exercise Vital Sign    Days of Exercise per Week: 7 days    Minutes of Exercise per Session: 150+ min  Stress:  Stress Concern Present (08/01/2022)   [employer] of Occupational Health - Occupational Stress Questionnaire    Feeling of Stress : To some extent  Social Connections: Moderately Integrated (08/01/2022)   Social Connection and Isolation Panel    Frequency of Communication with Friends and Family: Twice a week    Frequency of Social Gatherings with Friends and Family: Once a week    Attends Religious Services: Never    Database administrator or Organizations: Yes    Attends Engineer, structural: More than 4 times per year    Marital Status: Married    Allergies: No Known Allergies  Metabolic Disorder Labs: No results found for: HGBA1C, MPG No results found for: PROLACTIN No results found for: CHOL, TRIG, HDL, CHOLHDL, VLDL, LDLCALC Lab Results  Component Value Date   TSH 1.540 04/16/2022    Therapeutic Level Labs: No results found for: LITHIUM No results found for: VALPROATE No results found for: CBMZ  Current Medications: Current  Outpatient Medications  Medication Sig Dispense Refill   BIKTARVY 50-200-25 MG TABS tablet Take 1 tablet by mouth daily.     buPROPion  (WELLBUTRIN ) 75 MG tablet Take 1 tablet (75 mg total) by mouth as directed for 21 days. Take the 75 mg every morning for 15 days and then every other day for 5 doses and stop 21 tablet 0   cetirizine (ZYRTEC) 10 MG tablet Take by mouth daily.     cholecalciferol (VITAMIN D3) 25 MCG (1000 UNIT) tablet Take 1,000 Units by mouth daily.     enoxaparin (LOVENOX) 40 MG/0.4ML injection Inject 40 mg into the skin daily.     fluticasone (FLONASE) 50 MCG/ACT nasal spray as needed.     hydrocortisone (ANUSOL-HC) 2.5 % rectal cream Apply 1 Application topically as needed.     Prenatal Vit-Fe Fumarate-FA (PRENATAL MULTIVITAMIN) TABS tablet Take 1 tablet by mouth daily at 12 noon.     PREVIDENT 5000 ENAMEL PROTECT 1.1-5 % GEL Apply a small amount to teeth twice a day     sertraline  (ZOLOFT ) 50 MG tablet TAKE 1 TABLET BY MOUTH EVERY DAY 30 tablet 1   VENTOLIN HFA 108 (90 Base) MCG/ACT inhaler Inhale 1-2 puffs into the lungs every 6 (six) hours as needed.     No current facility-administered medications for this visit.     Musculoskeletal: Strength & Muscle Tone: UTA  Gait & Station: Seated Patient leans: N/A  Psychiatric Specialty Exam: Review of Systems  Psychiatric/Behavioral: Negative.      Last menstrual period 08/02/2023.There is no height or weight on file to calculate BMI.  General Appearance: Casual  Eye Contact:  Fair  Speech:  Clear and Coherent  Volume:  Normal  Mood:  Euthymic  Affect:  Congruent  Thought Process:  Goal Directed and Descriptions of Associations: Intact  Orientation:  Full (Time, Place, and Person)  Thought Content: Logical   Suicidal Thoughts:  No  Homicidal Thoughts:  No  Memory:  Immediate;   Fair Recent;   Fair Remote;   Fair  Judgement:  Fair  Insight:  Fair  Psychomotor Activity:  Normal  Concentration:  Concentration:  Fair and Attention Span: Fair  Recall:  [employer] of Knowledge: Fair  Language: Fair  Akathisia:  No  Handed:  Right  AIMS (if indicated): not done  Assets:  Communication Skills Desire for Improvement Housing Social Support Transportation  ADL's:  Intact  Cognition: WNL  Sleep:  Fair   Screenings: GAD-7  Flowsheet Row Office Visit from 03/06/2023 in [facility]  Total GAD-7 Score 11   PHQ2-9    Flowsheet Row Video Visit from 12/19/2023 in [facility] Office Visit from 07/15/2023 in [facility] [place] Office Visit from 03/06/2023 in [facility]  PHQ-2 Total Score 0 0 1  PHQ-9 Total Score -- -- 9   Flowsheet Row Video Visit from 12/19/2023 in [facility] ED from 08/26/2023 in [facility] at [facility] ED from 05/31/2023 in [facility] at [facility]  C-SSRS RISK CATEGORY No Risk No Risk No Risk     Assessment and Plan: Jasmine Price is a 28 year old Caucasian female who has a history of anxiety, PTSD, episodic mood disorder, PCOS, allergic rhinitis, history of HIV, history of factor V Leiden heterozygote, was evaluated by telemedicine today.  Patient is currently pregnant [redacted] weeks.  Discussed assessment and plan as noted below.  Generalized anxiety disorder-stable Currently denies any significant anxiety symptoms.  Zoloft  is beneficial. Continue Zoloft  50 mg daily Continue psychotherapy sessions with [facility].  PTSD-improving Currently denies any significant trauma related symptoms Continue Zoloft  as prescribed.  Continue psychotherapy sessions  Episodic mood disorder-stable Currently denies any significant depression or mood swings.  Interested in coming off of the Wellbutrin  and currently takes 150 mg  although prescribed 300 mg. Reduce Wellbutrin  to 75 mg daily for 2 weeks and then take it every other day for 5 doses and stop taking Continue Zoloft  as prescribed  Patient referred for neuropsychological testing-rule out ADHD.  Has upcoming appointment with Dr. Corina.  Follow-up Follow-up in clinic in 2 months or sooner in person.   Collaboration of Care: Collaboration of Care: Referral or follow-up with counselor/therapist AEB encouraged to continue psychotherapy sessions.  Patient/Guardian was advised Release of Information must be obtained prior to any record release in order to collaborate their care with an outside provider. Patient/Guardian was advised if they have not already done so to contact the registration department to sign all necessary forms in order for us  to release information regarding their care.   Consent: Patient/Guardian gives verbal consent for treatment and assignment of benefits for services provided during this visit. Patient/Guardian expressed understanding and agreed to proceed.   This note was generated in part or whole with voice recognition software. Voice recognition is usually quite accurate but there are transcription errors that can and very often do occur. I apologize for any typographical errors that were not detected and corrected.    Antion Andres, MD 12/19/2023, 12:39 PM

## 2024-02-06 ENCOUNTER — Encounter: Payer: Medicaid Other | Attending: Psychology | Admitting: Psychology

## 2024-02-06 DIAGNOSIS — R41840 Attention and concentration deficit: Secondary | ICD-10-CM | POA: Diagnosis not present

## 2024-02-06 DIAGNOSIS — F4310 Post-traumatic stress disorder, unspecified: Secondary | ICD-10-CM | POA: Diagnosis present

## 2024-02-06 DIAGNOSIS — F411 Generalized anxiety disorder: Secondary | ICD-10-CM | POA: Insufficient documentation

## 2024-02-06 DIAGNOSIS — F39 Unspecified mood [affective] disorder: Secondary | ICD-10-CM | POA: Diagnosis not present

## 2024-02-06 NOTE — Progress Notes (Signed)
 Neuropsychological Consultation   Patient:   Jasmine Price   DOB:   15-Oct-1995  MR Number:  [id number]  Location:  [facility] [address] [place], [address] [place] [address] Dept: [phone]           Date of Service:   02/06/2024  Location of Service and Individuals present: Today's visit was conducted in outpatient clinic office with the patient myself present.  Start Time:   10 AM End Time:   12 PM  1 hour and 15 minutes was spent in face-to-face clinical interview and the other 45 minutes was spent with record review, report writing and setting up testing protocols.  Patient Consent and Confidentiality: Limits of confidentiality were reviewed including the fact that the patient had been referred for neuropsychological evaluation and the patient was instructed as to what that would entail and the fact that a formal neuropsychological report will be produced and provided to her referring psychiatrist as well as being made available on the patient's electronic medical records for other appropriate professionals to have access to.  Patient consents to proceed with the evaluation.  A digital scribe was also employed to assist in notetaking and this visit and this was explained prior to its use and the patient consents to allow for digital scribe to be used.  The system is HIPAA compliant.  Consent for Evaluation and Treatment:  Signed:  Yes Explanation of Privacy Policies:  Signed:  Yes Discussion of Confidentiality Limits:  Yes  Provider/Observer:  Norleen Asa, Psy.D.       Clinical Neuropsychologist       Billing Code/Service: 96116/96121  Chief Complaint:     Chief Complaint  Patient presents with   Anxiety   Depression   Post-Traumatic Stress Disorder   Other    Attention and concentration difficulties    Reason for Service:    Jasmine Price is a 28 year old  female referred for neuropsychological assessment by her neurologist, Maryellen Barth, MD, to evaluate for a possible Adult Residual Attention Deficit Disorder. Past psychiatric history includes generalized anxiety Disorder, Post-Traumatic Stress Disorder, and episodic mood disorder. Past medical history is significant for Polycystic Ovary Syndrome (PCOS), attentional deficits, allergic rhinitis, Factor V Leiden heterozygous, and a history of HIV. HIV status is currently undetectable and the patient was quickly diagnosed and had been monitored as her husband had previously been diagnosed with HIV positive status. Current psychotropic medications include bupropion  and sertraline , which are taken consistently with reported improvement in anxiety and PTSD symptoms. She is actively engaged in psychotherapy, reports a stable mood, and denies recent significant depression or mood swings. The patient is currently pregnant following two prior losses and has concerns about medication use during pregnancy, though her provider has discussed the relative safety of her current regimen. A previous psychiatrist diagnosed ADHD, but she deferred treatment due to trying to conceive. She reports lifelong hyperactivity and being on the go, with difficulties first noted in childhood. A more recent concern is increased distractibility while driving. Family history is positive for mood disorder in her mother (untreated) and manic depression, schizophrenia, and alcoholism in her biological father. She reports poor sleep with morning fatigue and grogginess, and a home sleep study was recommended to assess for obstructive sleep apnea.  The patient is currently pregnant and following prenatal care guidelines.  Review of available medical records and talking about past history the patient denied any history of significant  concussive events.  However, the patient did have a fall in April of this year and given her past history of  heterogeneous factor V Leyden mutation pulmonary embolism and HIV positive status the patient was thoroughly assessed after presenting to the emergency department complaining after a fall.  The patient noted that she was cleaning the garage and a sharp hammer hit her head and the right side.  There was active bleeding but the patient denied any loss of consciousness of blurry vision.  Patient was taking Eliquis  a week prior after pulmonary embolism.  CT scan performed during this visit showed no acute process or abnormality  There is a significant family psychiatric history with the patient's biological father reportedly previously diagnosed with schizophrenia and bipolar disorder and while the patient's mother was never formally diagnosed or treated there is concerns around possible mood disorder.  The patient's son has recently been diagnosed with ADD/ADHD as well as cystic fibrosis.    Onset and Duration of Symptoms:  Attentional difficulties are reported retrospectively from childhood, including failing second grade due to refusing to read and being overly talkative. She reports a lifelong history of feeling hyperactive and on the go. She began taking psychiatric medication approximately five years ago. The onset of traumatic experiences that contributed to her PTSD diagnosis first occurred around age 2 but stressful/traumatic experience persisted throughout childhood.  Progression of Symptoms:  Symptoms of hyperactivity and distractibility are described as long-standing.  Sleep:  Reports difficulty falling asleep, typically around midnight, with frequent awakenings around 3:00 a.m. Describes feeling fatigued and groggy in the morning. A home sleep study was recommended to rule out obstructive sleep apnea as a contributing factor.  Medical History:   Past Medical History:  Diagnosis Date   Anxiety    Depression    HIV disease (HCC)    UNDETECTABLE   Pregnancy, ectopic, cornual or cervical  08/06/2021   Left wedge resection of cornual ectopic pregnancy         Patient Active Problem List   Diagnosis Date Noted   Heterozygous factor V Leiden mutation 08/15/2023   Allergic rhinitis 03/06/2023   PTSD (post-traumatic stress disorder) 03/06/2023   GAD (generalized anxiety disorder) 03/06/2023   Episodic mood disorder 03/06/2023   Attention and concentration deficit 03/06/2023   Supervision of other normal pregnancy, antepartum 08/01/2022   Abnormal uterine bleeding (AUB) 04/16/2022   Ectopic pregnancy without intrauterine pregnancy- Corunal ectopic Left       Behavioral Observation/Mental Status:   Jasmine Price  is a 28 year old left handed female who was cooperative and motivated for the evaluation. She participated actively in the interview. She is oriented to person, place, and situation. Speech was of normal rate and volume. Thought processes were coherent and goal-directed. Thought content was notable for a racing mind (my mind never stops), consistent with anxiety. She denies suicidal or homicidal ideation. Mood was described as stable, and affect was appropriate to content. Insight appears fair. Judgment seems intact. Intelligence is estimated to be in the average range.  Marital Status/Living:   Born in [place], [place], but lived there for only three months. She moved frequently throughout her childhood and adolescence, living in multiple states including [place] ,  , [place] , [place] , [place] , and [place] . She is currently married and pregnant. She has a son.  Educational and Occupational History:     Highest Level of Education: The patient repeated the ninth grade due to credit transfer issues from moving frequently (12 schools by ninth  grade) and was advised she would be too old to graduate. She subsequently left high school and obtained her GED on her own.  The patient reports that she always has noted that she was bright and intelligent and  actually was in advance placement/gifted classes leading up to high school.  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 She reports this is the most stable period of her adult life.  Hobbies and Interests: Hobbies include swimming and skating (ice skating and rollerblading). The importance of wearing a helmet, particularly for rollerblading, was discussed.  Psychiatric History:  Diagnoses include General Anxiety Disorder, Post-Traumatic Stress Disorder, and an episodic mood disorder. Her current psychiatrist, Dr. Coby, considered a diagnosis of bipolar disorder, but the patient disagrees with this, attributing her mood shifts to situational stressors and trauma. She has a significant trauma history beginning in early childhood (age 33). Family history is positive for mood disorder in her mother and schizophrenia/manic depression in her biological father.  Abuse/Trauma History: The patient has been diagnosed with posttraumatic stress disorder and describes significant stressors and traumas starting at least by age 63.   History of Substance Use or Abuse:  No concerns of substance abuse are reported.    No current concerns for substance abuse were reported. She described a negative experience with hydroxyzine in the past, finding it overly sedating. She also reported a negative experience with Ambien, which caused hallucinations.  Family Med/Psych History:  Family History  Problem Relation Age of Onset   Hypertension Mother    Eclampsia Mother        preeclampsia   Schizophrenia Father    Gout Father    Bipolar disorder Father    Healthy Brother    Healthy Brother    Cancer Maternal Grandmother 80       lung   Diabetes Maternal Grandmother    Diabetes Maternal Grandfather    COPD Maternal Grandfather    Cystic fibrosis Son         carrier   ADD / ADHD Son    Healthy Half-Brother    Healthy Half-Sister    Healthy Half-Sister    Healthy Half-Sister     Impression/DX:   Jasmine Price is a 28 year old female referred by Dr. Eappen for a neuropsychological evaluation to assist with differential diagnosis, specifically to rule out Adult Residual Attention Deficit Disorder. She presents with a complex history including GAD, PTSD, and an episodic mood disorder, with a history of significant childhood trauma beginning around age 89. She reports lifelong symptoms of hyperactivity and inattention. She is currently pregnant and has ceased some medications due to concerns for the fetus, though the safety of her current regimen was discussed. Her presentation is complicated by sleep disturbance, for which a sleep study was recommended. The evaluation is intended to differentiate attentional deficits related to a primary attention disorder from those secondary to anxiety, PTSD, or mood dysregulation to guide appropriate treatment, as certain ADHD treatments could exacerbate her anxiety or PTSD.  Disposition/Plan:   A neuropsychological evaluation is scheduled to objectively assess attention, concentration, and executive functioning. She will complete the [place]  Multiphasic Personality Inventory (MMPI) at home to assess personality and psychopathology. She will then return for a 2-hour in-person testing session with the psychometrician to complete a comprehensive battery of attention  tests. Following the evaluation, a formal report will be generated and made available in the EMR for the referring neurologist, Dr. Coby. A feedback session will be scheduled to review all findings and recommendations.  Diagnosis:    Attention and concentration deficit  PTSD (post-traumatic stress disorder)  GAD (generalized anxiety disorder)  Episodic mood disorder        Note: This document was prepared using Dragon voice recognition  software and may include unintentional dictation errors.   Electronically Signed   _______________________ Norleen Asa, Psy.D. Clinical Neuropsychologist

## 2024-02-17 ENCOUNTER — Encounter

## 2024-02-17 DIAGNOSIS — F411 Generalized anxiety disorder: Secondary | ICD-10-CM | POA: Diagnosis not present

## 2024-02-17 DIAGNOSIS — F39 Unspecified mood [affective] disorder: Secondary | ICD-10-CM | POA: Diagnosis not present

## 2024-02-17 DIAGNOSIS — F4310 Post-traumatic stress disorder, unspecified: Secondary | ICD-10-CM | POA: Diagnosis not present

## 2024-02-17 DIAGNOSIS — R41840 Attention and concentration deficit: Secondary | ICD-10-CM | POA: Diagnosis not present

## 2024-02-17 NOTE — Progress Notes (Signed)
   Behavioral Observations: The patient appeared well-groomed and appropriately dressed. Her manners were polite and appropriate to the situation. The patient's attitude towards testing was positive and she demonstrated a good effort. The patient reported feeling mildly somnolent towards the end of the CPT but was able to maintain consistent engagement throughout the rest of the test despite this.   Neuropsychology Note  Mackie Puder completed 85 minutes of neuropsychological testing with technician, Josue Ned, BA, under the supervision of Norleen Asa, PsyD., Clinical Neuropsychologist. The patient did not appear overtly distressed by the testing session, per behavioral observation or via self-report to the technician. Rest breaks were offered.   Clinical Decision Making: In considering the patient's current level of functioning, level of presumed impairment, nature of symptoms, emotional and behavioral responses during clinical interview, level of literacy, and observed level of motivation/effort, a battery of tests was selected by Dr. Asa during initial consultation on 02/06/2024. This was communicated to the technician. Communication between the neuropsychologist and technician was ongoing throughout the testing session and changes were made as deemed necessary based on patient performance on testing, technician observations and additional pertinent factors such as those listed above.  Tests Administered: Comprehensive Attention Battery (CAB) Continuous Performance Test (CPT)   Results: Will be included in final report   Feedback to Patient: Akeelah Daisey will return on 06/15/2024 for an interactive feedback session with Dr. Asa at which time her test performances, clinical impressions and treatment recommendations will be reviewed in detail. The patient understands she can contact our office should she require our assistance before this time.  85 minutes  spent face-to-face with patient administering standardized tests, 30 minutes spent scoring Radiographer, therapeutic). [CPT [id number], [id number]]  Full report to follow.

## 2024-02-20 ENCOUNTER — Ambulatory Visit (INDEPENDENT_AMBULATORY_CARE_PROVIDER_SITE_OTHER): Admitting: Psychiatry

## 2024-02-20 ENCOUNTER — Other Ambulatory Visit: Payer: Self-pay

## 2024-02-20 ENCOUNTER — Encounter: Payer: Self-pay | Admitting: Psychiatry

## 2024-02-20 VITALS — BP 114/80 | HR 91 | Temp 96.8°F | Ht 61.0 in | Wt 180.2 lb

## 2024-02-20 DIAGNOSIS — F39 Unspecified mood [affective] disorder: Secondary | ICD-10-CM | POA: Diagnosis not present

## 2024-02-20 DIAGNOSIS — F411 Generalized anxiety disorder: Secondary | ICD-10-CM | POA: Diagnosis not present

## 2024-02-20 DIAGNOSIS — F4310 Post-traumatic stress disorder, unspecified: Secondary | ICD-10-CM | POA: Diagnosis not present

## 2024-02-20 NOTE — Progress Notes (Unsigned)
 BH MD OP Progress Note  02/20/2024 1:53 PM Jasmine Price  MRN:  [id number]  Chief Complaint:  Chief Complaint  Patient presents with   Follow-up   Anxiety   Depression   Medication Refill   Discussed the use of AI scribe software for clinical note transcription with the patient, who gave verbal consent to proceed.  History of Present Illness Jasmine Price is a 28 year old with a history of generalized anxiety, PTSD, and episodic mood disorder presenting with anxiety, mood changes, and adjustment to pregnancy.  She describes feeling generally well regarding her mental health but reports increased anxiety and feeling antsy, which she relates to being less physically active during pregnancy and reducing her work activities. Increased sensitivity to overstimulation, particularly in noisy environments such as PE, leads her to seek quiet spaces for relief. Occasional low mood and periods of feeling somewhat depressed, especially when less busy, do not overwhelm her. To manage mood and anxiety, she uses coping mechanisms such as playing games, taking walks, and practicing distraction. Anticipation of her upcoming surgery increases her stress and fear about the procedure. She describes having strong support from her husband, friends, and family. She communicates openly with her OBGYN about her anxiety regarding surgery and feels mentally prepared for the event.  Mood fluctuations and occasional irritability, which she relates to hormonal changes during pregnancy, prompt her to monitor her mood closely and communicate with her husband about her emotional state, requesting patience as she experiences these changes. She feels confident in her ability to manage mood changes and actively seeks healthy coping strategies. Compared to previous pregnancies, she identifies her current support system as stronger and feels in a better headspace overall.  She reports discontinuing sertraline  50 mg a few  weeks to a month ago and previously stopped Wellbutrin  as discussed at her last visit. She is not currently taking any medications for anxiety or depression. She stopped her medications because she felt concerned about potential withdrawal effects on the baby if she continued them during pregnancy. She notes no significant emotional changes or worsening of depression since discontinuing her medications.  She describes her sleep as generally good but notes early morning insomnia, waking at 3-5 am, which she manages by having a snack to help her return to sleep. She relates some of these changes to pregnancy and is preparing for disrupted sleep after delivery.  She denies any thoughts of hurting herself or others.  Psychiatric History: She reports a history of long-term therapy. Her diagnoses include generalized anxiety disorder, PTSD, and episodic mood disorder as part of her past history.  Social History: Currently married and living with her spouse, she has 2 sons and is pregnant. She reports a strong support system at home and maintains many friendships. She previously worked in [employer] but recently discontinued due to physical limitations, now only providing drop-off services. For physical activity, she engages in walking and enjoys playing games as a hobby.  Medical History: She is currently pregnant at [redacted] weeks and has a history of prior miscarriages. She reports a possible hernia around her belly button. She is scheduled for a C-section in February. Her past surgical history includes prior surgery related to miscarriages.   Visit Diagnosis:    ICD-10-CM   1. GAD (generalized anxiety disorder)  F41.1     2. PTSD (post-traumatic stress disorder)  F43.10     3. Episodic mood disorder  F39       Past Psychiatric History: I have reviewed past  psychiatric history from progress note on 03/06/2023.  Past Medical History:  Past Medical History:  Diagnosis Date   Anxiety     Depression    HIV disease (HCC)    UNDETECTABLE   Pregnancy, ectopic, cornual or cervical 08/06/2021   Left wedge resection of cornual ectopic pregnancy    Past Surgical History:  Procedure Laterality Date   TYMPANOSTOMY TUBE PLACEMENT Right    2013 and previously as a child   WISDOM TOOTH EXTRACTION  2021   three;   XI ROBOTIC ASSISTED SALPINGECTOMY Left 08/06/2021   Procedure: XI ROBOTIC ASSISTED  Removal of Cornual Pregnancy;  Surgeon: Victor Claudell SAUNDERS, MD;  Location: [facility] ORS;  Service: Gynecology;  Laterality: Left;    Family Psychiatric History: I have reviewed family psychiatric history from progress note on 03/06/2023.  Family History:  Family History  Problem Relation Age of Onset   Hypertension Mother    Eclampsia Mother        preeclampsia   Schizophrenia Father    Gout Father    Bipolar disorder Father    Healthy Brother    Healthy Brother    Cancer Maternal Grandmother 18       lung   Diabetes Maternal Grandmother    Diabetes Maternal Grandfather    COPD Maternal Grandfather    Cystic fibrosis Son        carrier   ADD / ADHD Son    Healthy Half-Brother    Healthy Half-Sister    Healthy Half-Sister    Healthy Half-Sister     Social History: I have reviewed social history from progress note on 03/06/2023. Social History   Socioeconomic History   Marital status: Married    Spouse name: Nancyann   Number of children: 2   Years of education: 14   Highest education level: Not on file  Occupational History   Occupation: Research scientist (medical)  Tobacco Use   Smoking status: Former    Types: Cigarettes   Smokeless tobacco: Never  Vaping Use   Vaping status: Every Day   Devices: for a month then stopped  Substance and Sexual Activity   Alcohol use: No    Comment: rarely   Drug use: Never   Sexual activity: Yes    Partners: Male    Birth control/protection: None  Other Topics Concern   Not on file  Social History Narrative   Not on file   Social  Drivers of Health   Financial Resource Strain: Medium Risk (08/01/2022)   Overall Financial Resource Strain (CARDIA)    Difficulty of Paying Living Expenses: Somewhat hard  Food Insecurity: No Food Insecurity (11/25/2023)   [facility]   Hunger Vital Sign    Within the past 12 months, you worried that your food would run out before you got the money to buy more.: Never true    Within the past 12 months, the food you bought just didn't last and you didn't have money to get more.: Never true  Transportation Needs: No Transportation Needs (11/25/2023)   Received from [facility]   PRAPARE - Transportation    Lack of Transportation (Medical): No    Lack of Transportation (Non-Medical): No  Physical Activity: Sufficiently Active (08/01/2022)   Exercise Vital Sign    Days of Exercise per Week: 7 days    Minutes of Exercise per Session: 150+ min  Stress: Stress Concern Present (08/01/2022)   [employer] of Occupational Health - Occupational Stress Questionnaire  Feeling of Stress : To some extent  Social Connections: Moderately Integrated (08/01/2022)   Social Connection and Isolation Panel    Frequency of Communication with Friends and Family: Twice a week    Frequency of Social Gatherings with Friends and Family: Once a week    Attends Religious Services: Never    Database administrator or Organizations: Yes    Attends Engineer, structural: More than 4 times per year    Marital Status: Married    Allergies: No Known Allergies  Metabolic Disorder Labs: No results found for: HGBA1C, MPG No results found for: PROLACTIN No results found for: CHOL, TRIG, HDL, CHOLHDL, VLDL, LDLCALC Lab Results  Component Value Date   TSH 1.540 04/16/2022    Therapeutic Level Labs: No results found for: LITHIUM No results found for: VALPROATE No results found for: CBMZ  Current Medications: Current Outpatient Medications  Medication Sig  Dispense Refill   BIKTARVY 50-200-25 MG TABS tablet Take 1 tablet by mouth daily.     cholecalciferol (VITAMIN D3) 25 MCG (1000 UNIT) tablet Take 1,000 Units by mouth daily.     enoxaparin (LOVENOX) 40 MG/0.4ML injection Inject 40 mg into the skin daily.     Prenatal Vit-Fe Fumarate-FA (PRENATAL MULTIVITAMIN) TABS tablet Take 1 tablet by mouth daily at 12 noon.     PREVIDENT 5000 ENAMEL PROTECT 1.1-5 % GEL Apply a small amount to teeth twice a day     No current facility-administered medications for this visit.     Musculoskeletal: Strength & Muscle Tone: within normal limits Gait & Station: normal Patient leans: N/A  Psychiatric Specialty Exam: Review of Systems  Psychiatric/Behavioral: Negative.      Blood pressure 114/80, pulse 91, temperature (!) 96.8 F (36 C), temperature source Temporal, height 5' 1 (1.549 m), weight 180 lb 3.2 oz (81.7 kg), last menstrual period 08/02/2023.Body mass index is 34.05 kg/m.  General Appearance: Casual  Eye Contact:  Fair  Speech:  Clear and Coherent  Volume:  Normal  Mood:  Euthymic  Affect:  Appropriate  Thought Process:  Goal Directed and Descriptions of Associations: Intact  Orientation:  Full (Time, Place, and Person)  Thought Content: Logical   Suicidal Thoughts:  No  Homicidal Thoughts:  No  Memory:  Immediate;   Fair Recent;   Fair Remote;   Fair  Judgement:  Fair  Insight:  Fair  Psychomotor Activity:  Normal  Concentration:  Concentration: Fair and Attention Span: Fair  Recall:  [employer] of Knowledge: Fair  Language: Fair  Akathisia:  No  Handed:  Right  AIMS (if indicated): not done  Assets:  Communication Skills Desire for Improvement Housing Intimacy Social Support Transportation  ADL's:  Intact  Cognition: WNL  Sleep:  Fair   Screenings: GAD-7    Garment/textile technologist Visit from 02/20/2024 in [facility] Office Visit from 03/06/2023 in [facility]  Total GAD-7 Score 3 11   PHQ2-9    Flowsheet Row Office Visit from 02/20/2024 in [facility] Video Visit from 12/19/2023 in [facility] Office Visit from 07/15/2023 in [facility] [place] Office Visit from 03/06/2023 in [facility]  PHQ-2 Total Score 0 0 0 1  PHQ-9 Total Score -- -- -- 9   Flowsheet Row Video Visit from 12/19/2023 in [facility] ED from 08/26/2023 in [facility] Department at [facility] ED from 05/31/2023 in [facility] Emergency Department at [facility]  C-SSRS RISK CATEGORY No Risk No Risk No Risk     Assessment and Plan: Hanan Traister is a 28 year old Caucasian female who has a history of anxiety, PTSD, episodic mood disorder, currently pregnant, 18 weeks 6 days, PCOS, allergic rhinitis, history of HIV, history of factor V Leyden heterozygote, was evaluated in office today for a follow-up appointment was evaluated in office today.  1. GAD (generalized anxiety disorder)-stable Currently denies any significant anxiety symptoms of than situational and is currently managing it well. Discontinue sertraline , tapered self off of it. Continue psychotherapy sessions with Jasmine Price.  2. PTSD (post-traumatic stress disorder)-stable Currently denies any concerns  3. Episodic mood disorder-stable Although with occasional mood swings overall managing well.  Currently not on any antidepressant medications. Could reinitiate sertraline  in the future if needed.  Patient to keep monitor her mood symptoms closely and let this provider know.   Completed neuropsychological testing per Dr. Metta report.  Follow-up Follow-up in clinic as needed.   Collaboration of Care: Collaboration of Care: Referral or follow-up  with counselor/therapist AEB encouraged to continue psychotherapy sessions with Jasmine Price.  Patient/Guardian was advised Release of Information must be obtained prior to any record release in order to collaborate their care with an outside provider. Patient/Guardian was advised if they have not already done so to contact the registration department to sign all necessary forms in order for us  to release information regarding their care.   Consent: Patient/Guardian gives verbal consent for treatment and assignment of benefits for services provided during this visit. Patient/Guardian expressed understanding and agreed to proceed.   This note was generated in part or whole with voice recognition software. Voice recognition is usually quite accurate but there are transcription errors that can and very often do occur. I apologize for any typographical errors that were not detected and corrected.    Jasmine Vankuren, MD 02/20/2024, 1:53 PM

## 2024-02-28 NOTE — Telephone Encounter (Signed)
 R/f request

## 2024-06-10 ENCOUNTER — Ambulatory Visit: Admitting: Psychology

## 2024-06-15 ENCOUNTER — Ambulatory Visit: Admitting: Psychology

## 2024-07-07 ENCOUNTER — Ambulatory Visit: Admitting: Psychiatry
